# Patient Record
Sex: Female | Born: 1995 | Race: Black or African American | Hispanic: No | Marital: Single | Smoking: Current some day smoker
Health system: Southern US, Community
[De-identification: ages and names within clinical notes are randomized; demographics above are authoritative.]

## PROBLEM LIST (undated history)

## (undated) ENCOUNTER — Inpatient Hospital Stay (HOSPITAL_COMMUNITY): Payer: Self-pay

## (undated) DIAGNOSIS — D649 Anemia, unspecified: Secondary | ICD-10-CM

## (undated) DIAGNOSIS — R011 Cardiac murmur, unspecified: Secondary | ICD-10-CM

## (undated) HISTORY — PX: NO PAST SURGERIES: SHX2092

---
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
## 2014-02-22 ENCOUNTER — Ambulatory Visit: Payer: Medicaid Other | Admitting: Family Medicine

## 2014-04-20 ENCOUNTER — Ambulatory Visit: Payer: Medicaid Other | Admitting: Family Medicine

## 2014-06-04 ENCOUNTER — Emergency Department (HOSPITAL_COMMUNITY)
Admission: EM | Admit: 2014-06-04 | Discharge: 2014-06-04 | Disposition: A | Discharge status: Home / Self Care | Payer: Medicaid Other | Attending: Emergency Medicine | Admitting: Emergency Medicine

## 2014-06-04 ENCOUNTER — Encounter (HOSPITAL_COMMUNITY): Payer: Self-pay

## 2014-06-04 DIAGNOSIS — Z792 Long term (current) use of antibiotics: Secondary | ICD-10-CM | POA: Diagnosis not present

## 2014-06-04 DIAGNOSIS — Z791 Long term (current) use of non-steroidal anti-inflammatories (NSAID): Secondary | ICD-10-CM | POA: Insufficient documentation

## 2014-06-04 DIAGNOSIS — Z862 Personal history of diseases of the blood and blood-forming organs and certain disorders involving the immune mechanism: Secondary | ICD-10-CM | POA: Insufficient documentation

## 2014-06-04 DIAGNOSIS — Z3202 Encounter for pregnancy test, result negative: Secondary | ICD-10-CM | POA: Insufficient documentation

## 2014-06-04 DIAGNOSIS — Z880 Allergy status to penicillin: Secondary | ICD-10-CM | POA: Diagnosis not present

## 2014-06-04 DIAGNOSIS — N898 Other specified noninflammatory disorders of vagina: Secondary | ICD-10-CM | POA: Diagnosis not present

## 2014-06-04 LAB — URINE MICROSCOPIC-ADD ON

## 2014-06-04 LAB — POC URINE PREG, ED: Preg Test, Ur: NEGATIVE

## 2014-06-04 LAB — URINALYSIS, ROUTINE W REFLEX MICROSCOPIC
Bilirubin Urine: NEGATIVE
Glucose, UA: NEGATIVE mg/dL
KETONES UR: NEGATIVE mg/dL
Nitrite: NEGATIVE
PH: 6.5 (ref 5.0–8.0)
Protein, ur: NEGATIVE mg/dL
Specific Gravity, Urine: 1.024 (ref 1.005–1.030)
UROBILINOGEN UA: 1 mg/dL (ref 0.0–1.0)

## 2014-06-04 LAB — WET PREP, GENITAL
Clue Cells Wet Prep HPF POC: NONE SEEN
TRICH WET PREP: NONE SEEN
Yeast Wet Prep HPF POC: NONE SEEN

## 2014-06-04 NOTE — ED Notes (Signed)
MD at the bedside  

## 2014-06-04 NOTE — ED Notes (Signed)
Per Patient, Patient has started using scented body wash, and she "thinks it may have messed up my pH." Patient reports scant amount of vaginal discharge and some foul odor. Denies pain, discomfort, burning or itching. Patient is alert and oriented x4. Patient denies any other symptoms.

## 2014-06-04 NOTE — ED Notes (Signed)
MD Ward at the bedside. Pelvis exam completed with assistance.

## 2014-06-04 NOTE — ED Notes (Signed)
Patient given drink Ale

## 2014-06-04 NOTE — ED Provider Notes (Signed)
TIME SEEN: 11:20 AM  CHIEF COMPLAINT: Vaginal discharge  HPI: Patient is a 19 year old G1 P1 who presents to the emergency department 3-4 days of vaginal discharge. Describes it as looking like "mucus" and having a foul odor. Last menstrual period was April 13. She has not been sexually active since 2015. No history of STDs. Denies fevers, chills, nausea, vomiting or diarrhea. No dysuria or hematuria. No abnormal vaginal bleeding. Denies abdominal pain. No history of abdominal surgery.  ROS: See HPI Constitutional: no fever  Eyes: no drainage  ENT: no runny nose   Cardiovascular:  no chest pain  Resp: no SOB  GI: no vomiting GU: no dysuria Integumentary: no rash  Allergy: no hives  Musculoskeletal: no leg swelling  Neurological: no slurred speech ROS otherwise negative  PAST MEDICAL HISTORY/PAST SURGICAL HISTORY:  Past Medical History  Diagnosis Date  . Anemia 2013    MEDICATIONS:  Prior to Admission medications   Medication Sig Start Date End Date Taking? Authorizing Provider  erythromycin ophthalmic ointment Place 1 application into the right eye 4 (four) times daily. One-half inch (1.25 cm) four times daily for  7 days. No contacts x10 days. 06/28/13   Shelva MajesticStephen O Hunter, MD  ibuprofen (ADVIL,MOTRIN) 800 MG tablet Take 1 tablet (800 mg total) by mouth 3 (three) times daily. Take with food 05/25/13   Mellody DrownLauren Parker, PA-C    ALLERGIES:  Allergies  Allergen Reactions  . Penicillins Rash  . Sulfa Antibiotics Rash  . Sulfonamide Derivatives Rash    SOCIAL HISTORY:  History  Substance Use Topics  . Smoking status: Never Smoker   . Smokeless tobacco: Not on file  . Alcohol Use: No     Comment: 1x month    FAMILY HISTORY: Family History  Problem Relation Age of Onset  . Hypertension Mother   . Diabetes Maternal Aunt   . Hypertension Maternal Aunt   . Thyroid disease Maternal Aunt   . Hypertension Maternal Uncle   . Diabetes Maternal Grandmother   . Diabetes Maternal  Grandfather     EXAM: BP 110/65 mmHg  Pulse 92  Temp(Src) 98.5 F (36.9 C) (Oral)  Resp 16  Ht 5\' 5"  (1.651 m)  Wt 163 lb (73.936 kg)  BMI 27.12 kg/m2  SpO2 97%  LMP 05/14/2014 CONSTITUTIONAL: Alert and oriented and responds appropriately to questions. Well-appearing; well-nourished, nontoxic, pleasant, smiling HEAD: Normocephalic EYES: Conjunctivae clear, PERRL ENT: normal nose; no rhinorrhea; moist mucous membranes; pharynx without lesions noted NECK: Supple, no meningismus, no LAD  CARD: RRR; S1 and S2 appreciated; no murmurs, no clicks, no rubs, no gallops RESP: Normal chest excursion without splinting or tachypnea; breath sounds clear and equal bilaterally; no wheezes, no rhonchi, no rales,  ABD/GI: Normal bowel sounds; non-distended; soft, non-tender, no rebound, no guarding GU:  Normal external genitalia, patient has a moderate amount of thin white vaginal discharge on exam with a slightly friable cervix that has a small amount of bleeding after being swabbed but no adnexal toes or fullness, no cervical motion tenderness, no significant vaginal bleeding BACK:  The back appears normal and is non-tender to palpation, there is no CVA tenderness EXT: Normal ROM in all joints; non-tender to palpation; no edema; normal capillary refill; no cyanosis    SKIN: Normal color for age and race; warm NEURO: Moves all extremities equally PSYCH: The patient's mood and manner are appropriate. Grooming and personal hygiene are appropriate.  MEDICAL DECISION MAKING: Patient here with vaginal discharge. Gonorrhea and Chlamydia cultures are  pending. She declines HIV and syphilis testing today. Wet prep shows many wbc's but no East, clue cells or trichomonas. Pregnancy test is negative. Urine does show moderate hemoglobin, moderate leukocytes, few bacteria but also many squamous cells. May be dirty catch. She has known urinary symptoms at this time I do not feel she needs to be started on antibiotics.  We'll get outpatient OB/GYN follow-up information. Discussed return precautions. She verbalized understanding and is comfortable with plan.      Layla Maw Selvin Yun, DO 06/04/14 1307

## 2014-06-04 NOTE — Discharge Instructions (Signed)
You were seen in the emergency department for vaginal discharge. Your pelvic swabs today showed no yeast, bacterial vaginosis or trichomonas. Chlamydia and gonorrhea cultures are pending. If you are positive for these you  will be contacted and started on antibiotics. Your urine showed possible signs of infection may give in your not having symptoms of UTI I do not think you need to be started on antibiotics at this time. If you begin having pain when you urinate, blood in your urine, feeling you are urinating more frequently or have increased urgency to use the restroom, please follow-up with your primary care provider.     [email or web address] [address] [place], [place] [phone]    [facility] [email or web address] [address] [place], [place] [phone](336) [phone]    [facility] [address] [place], [place] [phone]   [email or web address] [address] [place], [place] [phone]   [facility] [email or web address] [address] [place], [place] [phone]   [facility] [email or web address] [address] [place], [place] [phone]

## 2014-06-05 LAB — GC/CHLAMYDIA PROBE AMP (~~LOC~~) NOT AT ARMC
CHLAMYDIA, DNA PROBE: NEGATIVE
Neisseria Gonorrhea: NEGATIVE

## 2014-06-05 LAB — URINE CULTURE
COLONY COUNT: NO GROWTH
Culture: NO GROWTH

## 2014-06-06 ENCOUNTER — Encounter (HOSPITAL_COMMUNITY): Payer: Self-pay | Admitting: Emergency Medicine

## 2014-06-06 ENCOUNTER — Emergency Department (HOSPITAL_COMMUNITY): Payer: Medicaid Other

## 2014-06-06 ENCOUNTER — Emergency Department (HOSPITAL_COMMUNITY)
Admission: EM | Admit: 2014-06-06 | Discharge: 2014-06-07 | Disposition: A | Discharge status: Home / Self Care | Payer: Medicaid Other | Attending: Emergency Medicine | Admitting: Emergency Medicine

## 2014-06-06 DIAGNOSIS — Z862 Personal history of diseases of the blood and blood-forming organs and certain disorders involving the immune mechanism: Secondary | ICD-10-CM | POA: Diagnosis not present

## 2014-06-06 DIAGNOSIS — Z720 Tobacco use: Secondary | ICD-10-CM | POA: Diagnosis not present

## 2014-06-06 DIAGNOSIS — R079 Chest pain, unspecified: Secondary | ICD-10-CM | POA: Diagnosis not present

## 2014-06-06 DIAGNOSIS — R52 Pain, unspecified: Secondary | ICD-10-CM | POA: Diagnosis present

## 2014-06-06 DIAGNOSIS — R011 Cardiac murmur, unspecified: Secondary | ICD-10-CM | POA: Diagnosis not present

## 2014-06-06 DIAGNOSIS — Z880 Allergy status to penicillin: Secondary | ICD-10-CM | POA: Diagnosis not present

## 2014-06-06 DIAGNOSIS — E876 Hypokalemia: Secondary | ICD-10-CM | POA: Insufficient documentation

## 2014-06-06 HISTORY — DX: Cardiac murmur, unspecified: R01.1

## 2014-06-06 LAB — CBC WITH DIFFERENTIAL/PLATELET
Basophils Absolute: 0 10*3/uL (ref 0.0–0.1)
Basophils Relative: 0 % (ref 0–1)
Eosinophils Absolute: 0.1 10*3/uL (ref 0.0–0.7)
Eosinophils Relative: 2 % (ref 0–5)
HEMATOCRIT: 35 % — AB (ref 36.0–46.0)
Hemoglobin: 11 g/dL — ABNORMAL LOW (ref 12.0–15.0)
Lymphocytes Relative: 38 % (ref 12–46)
Lymphs Abs: 1.4 10*3/uL (ref 0.7–4.0)
MCH: 28.7 pg (ref 26.0–34.0)
MCHC: 31.4 g/dL (ref 30.0–36.0)
MCV: 91.4 fL (ref 78.0–100.0)
Monocytes Absolute: 0.2 10*3/uL (ref 0.1–1.0)
Monocytes Relative: 6 % (ref 3–12)
NEUTROS PCT: 54 % (ref 43–77)
Neutro Abs: 2 10*3/uL (ref 1.7–7.7)
Platelets: 315 10*3/uL (ref 150–400)
RBC: 3.83 MIL/uL — AB (ref 3.87–5.11)
RDW: 13.9 % (ref 11.5–15.5)
WBC: 3.7 10*3/uL — AB (ref 4.0–10.5)

## 2014-06-06 LAB — I-STAT CHEM 8, ED
BUN: 9 mg/dL (ref 6–23)
Calcium, Ion: 1.23 mmol/L (ref 1.12–1.23)
Chloride: 106 mmol/L (ref 96–112)
Creatinine, Ser: 0.7 mg/dL (ref 0.50–1.10)
Glucose, Bld: 133 mg/dL — ABNORMAL HIGH (ref 70–99)
HCT: 35 % — ABNORMAL LOW (ref 36.0–46.0)
Hemoglobin: 11.9 g/dL — ABNORMAL LOW (ref 12.0–15.0)
POTASSIUM: 3.2 mmol/L — AB (ref 3.5–5.1)
SODIUM: 142 mmol/L (ref 135–145)
TCO2: 19 mmol/L (ref 0–100)

## 2014-06-06 NOTE — ED Notes (Signed)
Pt states her body aches, has general weakness, and chest hurts  Pt denies any cold symptoms, N/V/D  Sxs started 2 days ago

## 2014-06-06 NOTE — ED Provider Notes (Signed)
CSN: [id number]     Arrival date & time 06/06/14  2147 History  This chart was scribed for non-physician practitioner, Earley Favor, NP, working with Mancel Bale, MD, by Modena Jansky, ED Scribe. This patient was seen in room WTR7/WTR7 and the patient's care was started at 10:13 PM.    Chief Complaint  Patient presents with  . Generalized Body Aches   The history is provided by the patient. No language interpreter was used.   HPI Comments: Jill Dennis is a 19 y.o. female who presents to the Emergency Department complaining of intermittent moderate chest pain that started 2 days ago. She reports that she has been having chest pain present only with deep breaths the past 2 days ago. She states that she has also been having generalized myalgias and weakness. She reports taking ibuprofen without any relief. She denies any sick contacts or hx of asthma or allerigies. She also denies any cough, rhinorrhea, nausea, vomiting, diarrhea, fever, rash, sore throat, ear pain, decreased appetite, or urinary symptoms.  She does have a history of anemia  Past Medical History  Diagnosis Date  . Anemia 2013  . Heart murmur    Past Surgical History  Procedure Laterality Date  . No past surgeries     Family History  Problem Relation Age of Onset  . Hypertension Mother   . Diabetes Maternal Aunt   . Hypertension Maternal Aunt   . Thyroid disease Maternal Aunt   . Hypertension Maternal Uncle   . Diabetes Maternal Grandmother   . Diabetes Maternal Grandfather    History  Substance Use Topics  . Smoking status: Current Every Day Smoker    Types: Cigarettes  . Smokeless tobacco: Not on file  . Alcohol Use: Yes     Comment: social   OB History    Gravida Para Term Preterm AB TAB SAB Ectopic Multiple Living   0 0 0 0 0 0 1     Review of Systems  Constitutional: Negative for fever and appetite change.  HENT: Negative for ear pain, rhinorrhea and sore throat.   Respiratory: Negative for  cough.   Cardiovascular: Positive for chest pain.  Gastrointestinal: Negative for nausea, vomiting and diarrhea.  Genitourinary: Negative for dysuria.  Musculoskeletal: Positive for myalgias (generalized).  Skin: Negative for rash.  Neurological: Positive for weakness (generalized).  All other systems reviewed and are negative.   Allergies  Penicillins; Sulfa antibiotics; and Sulfonamide derivatives  Home Medications   Prior to Admission medications   Medication Sig Start Date End Date Taking? Authorizing Provider  erythromycin ophthalmic ointment Place 1 application into the right eye 4 (four) times daily. One-half inch (1.25 cm) four times daily for  7 days. No contacts x10 days. Patient not taking: Reported on 06/04/2014 06/28/13   Shelva Majestic, MD  ibuprofen (ADVIL,MOTRIN) 800 MG tablet Take 1 tablet (800 mg total) by mouth 3 (three) times daily. Take with food Patient not taking: Reported on 06/04/2014 05/25/13   Mellody Drown, PA-C   BP 124/94 mmHg  Pulse 80  Temp(Src) 99 F (37.2 C) (Oral)  Resp 18  SpO2 100%  LMP 05/24/2014 (Approximate) Physical Exam  Constitutional: She is oriented to person, place, and time. She appears well-developed and well-nourished.  Eyes: Pupils are equal, round, and reactive to light.  Neck: Normal range of motion.  Cardiovascular: Normal rate.   Pulmonary/Chest: She exhibits tenderness.  Abdominal: Soft. Bowel sounds are normal.  Musculoskeletal: Normal range of motion.  Neurological: She is alert and oriented to person, place, and time.  Skin: Skin is warm.  Nursing note and vitals reviewed.   ED Course  Procedures (including critical care time) DIAGNOSTIC STUDIES: Oxygen Saturation is 100% on RA, normal by my interpretation.    COORDINATION OF CARE: 10:17 PM- Pt advised of plan for treatment which includes radiology and pt agrees.  Labs Review Labs Reviewed  CBC WITH DIFFERENTIAL/PLATELET - Abnormal; Notable for the following:     WBC 3.7 (*)    RBC 3.83 (*)    Hemoglobin 11.0 (*)    HCT 35.0 (*)    All other components within normal limits  I-STAT CHEM 8, ED - Abnormal; Notable for the following:    Potassium 3.2 (*)    Glucose, Bld 133 (*)    Hemoglobin 11.9 (*)    HCT 35.0 (*)    All other components within normal limits    Imaging Review Dg Chest 2 View  06/06/2014   CLINICAL DATA:  19 year old female with chest pain and shortness of breath  EXAM: CHEST  2 VIEW  COMPARISON:  Prior chest x-ray 02/26/2001  FINDINGS: The lungs are clear and negative for focal airspace consolidation, pulmonary edema or suspicious pulmonary nodule. No pleural effusion or pneumothorax. Cardiac and mediastinal contours are within normal limits. No acute fracture or lytic or blastic osseous lesions. The visualized upper abdominal bowel gas pattern is unremarkable.  IMPRESSION: Normal chest x-ray.   Electronically Signed   By: Malachy MoanHeath  McCullough M.D.   On: 06/06/2014 23:09     EKG Interpretation None     She was found to have a slightly lower potassium than normal.  She's been given supplements for the next 10 days as well as dietary recommendations.  This is most likely her feeling of weakness and fatigue MDM   Final diagnoses:  Pain  Hypokalemia         Earley FavorGail Zennie Ayars, NP 06/07/14 0009  Mancel BaleElliott Wentz, MD 06/08/14 1416

## 2014-06-07 MED ORDER — POTASSIUM CHLORIDE CRYS ER 10 MEQ PO TBCR: 10.0000 meq | EXTENDED_RELEASE_TABLET | Freq: Every day | ORAL | Status: DC

## 2014-06-07 MED ORDER — POTASSIUM CHLORIDE CRYS ER 20 MEQ PO TBCR
20.0000 meq | EXTENDED_RELEASE_TABLET | Freq: Once | ORAL | Status: AC
  Administered 2014-06-07: 20 meq via ORAL
  Filled 2014-06-07: qty 1

## 2014-06-07 NOTE — Discharge Instructions (Signed)
Chest x-ray is normal, but your blood work shows that you are low potassium been given a supplement and some dietary recommendations

## 2014-08-10 ENCOUNTER — Ambulatory Visit (INDEPENDENT_AMBULATORY_CARE_PROVIDER_SITE_OTHER): Payer: Medicaid Other | Admitting: Family Medicine

## 2014-08-10 ENCOUNTER — Encounter: Payer: Self-pay | Admitting: Family Medicine

## 2014-08-10 VITALS — BP 112/60 | HR 71 | Temp 98.7°F | Ht 65.0 in | Wt 155.1 lb

## 2014-08-10 DIAGNOSIS — M25511 Pain in right shoulder: Secondary | ICD-10-CM

## 2014-08-10 NOTE — Patient Instructions (Signed)
This is most likely a muscle spasm. Use heat therapy to this area. Also try ibuprofen 600mg  3-4 times daily with food and plenty of water. Do exercises 1-2 times EVERY day to stretch neck muscles. Come back in 2 weeks if no better or immediately if you have new symptoms, numbness/tingling in your arms, weakness, or other concerns.  Best,  Leona SingletonMaria T Viliami Bracco, MD

## 2014-08-10 NOTE — Progress Notes (Signed)
Patient ID: Jill BellowCiera M Patnode, female   DOB: 1995/06/20, 19 y.o.   MRN: [id number] Subjective:   CC: Right arm pain  HPI:   Patient presents to [facility] with 2 days of right arm pain with no obvious inciting factor. Pain is worse with heavy lifting especially if 90 degrees or more. Pain is entire top shoulder, unable to further specify. Denies neck pain, numbness, or tingling. Unable to further characterize pain. Radiates down arm slightly but not to elbow or beyond. Denies fevers, chills, swelling, redness, or history of problem with this area. Has tried ibuprofen 800mg  x 2 which did not help. ROM is limited by pain. Feels a "clicking" in the entire top shoulder. No skin changes or issues with neck ROM.  Review of Systems - Per HPI.   PMH - acne, anemia    Objective:  Physical Exam BP 112/60 mmHg  Pulse 71  Temp(Src) 98.7 F (37.1 C) (Oral)  Ht 5\' 5"  (1.651 m)  Wt 155 lb 1.6 oz (70.353 kg)  BMI 25.81 kg/m2 GEN: NAD EXT: No erythema or deformity of shoudler/neck Right shoulder ROM limited by pain (active flexion and abduction right shoulder to 160 deg, full passive ROM) Tenderness generalized to anterior shoulder and over acromion Mild bicipital groove tenderness along with trapezius tenderness No rash Positive speeds, empty can, and hawkins testing     Assessment:     Jill BellowCiera M Strother is a 19 y.o. female here for shoulder pain.    Plan:     # See problem list and after visit summary for problem-specific plans.   Follow-up: Follow up in 2 weeks to re-evaluate pain.    Leona SingletonMaria T Orlanda Lemmerman, MD [facility]

## 2014-08-17 ENCOUNTER — Encounter (HOSPITAL_COMMUNITY): Payer: Self-pay | Admitting: Emergency Medicine

## 2014-08-17 ENCOUNTER — Emergency Department (HOSPITAL_COMMUNITY)
Admission: EM | Admit: 2014-08-17 | Discharge: 2014-08-17 | Disposition: A | Discharge status: Home / Self Care | Payer: Medicaid Other | Attending: Emergency Medicine | Admitting: Emergency Medicine

## 2014-08-17 ENCOUNTER — Emergency Department (HOSPITAL_COMMUNITY): Payer: Medicaid Other

## 2014-08-17 DIAGNOSIS — Z720 Tobacco use: Secondary | ICD-10-CM | POA: Diagnosis not present

## 2014-08-17 DIAGNOSIS — R011 Cardiac murmur, unspecified: Secondary | ICD-10-CM | POA: Diagnosis not present

## 2014-08-17 DIAGNOSIS — M25511 Pain in right shoulder: Secondary | ICD-10-CM | POA: Insufficient documentation

## 2014-08-17 DIAGNOSIS — M79601 Pain in right arm: Secondary | ICD-10-CM | POA: Diagnosis present

## 2014-08-17 DIAGNOSIS — Z862 Personal history of diseases of the blood and blood-forming organs and certain disorders involving the immune mechanism: Secondary | ICD-10-CM | POA: Insufficient documentation

## 2014-08-17 DIAGNOSIS — Z880 Allergy status to penicillin: Secondary | ICD-10-CM | POA: Insufficient documentation

## 2014-08-17 DIAGNOSIS — Z79899 Other long term (current) drug therapy: Secondary | ICD-10-CM | POA: Diagnosis not present

## 2014-08-17 MED ORDER — OXYCODONE-ACETAMINOPHEN 5-325 MG PO TABS
ORAL_TABLET | ORAL | Status: AC
  Filled 2014-08-17: qty 1

## 2014-08-17 MED ORDER — NAPROXEN 500 MG PO TABS: 500.0000 mg | ORAL_TABLET | Freq: Two times a day (BID) | ORAL | Status: DC

## 2014-08-17 MED ORDER — OXYCODONE-ACETAMINOPHEN 5-325 MG PO TABS: 1.0000 | ORAL_TABLET | Freq: Once | ORAL | Status: DC

## 2014-08-17 NOTE — ED Notes (Signed)
Patient transported to X-ray 

## 2014-08-17 NOTE — ED Notes (Signed)
Pt states- right shoulder and arm pain starting two weeks ago. Saw an MD last week for same. No prescriptions given but given stretches, reports no relief with stretches. Pt unable to lift her right arm up and has right shoulder and arm pain at rest. Pt took Ibuprofen with no relief.

## 2014-08-17 NOTE — ED Notes (Signed)
Pt refused a percocet

## 2014-08-17 NOTE — ED Provider Notes (Signed)
CSN: [id number]     Arrival date & time 08/17/14  1905 History  This chart was scribed for non-physician provider Jaynie Crumble, PA-C, working with Toy Cookey, MD by Phillis Haggis, ED Scribe. This patient was seen in room [facility] and patient care was started at 8:06 PM.    Chief Complaint  Patient presents with  . Arm Pain   The history is provided by the patient. No language interpreter was used.  HPI Comments: Jill Dennis is a 19 y.o. female who presents to the Emergency Department complaining ofright shoulder and arm pain onset two weeks ago. She states that she cannot lift her arm; was seen by her PCP who told her it was a muscular strain, no x-rays were performed. She states that she does not know if any injury could have caused the pain but states she does some heavy lifting at work. She reports some numbness to her hands; has been stretching the arm to no relief and states pain worsens at night. She denies fever, chills, neck pain, or weakness. Denies history of similar symptoms.   Past Medical History  Diagnosis Date  . Anemia 2013  . Heart murmur    Past Surgical History  Procedure Laterality Date  . No past surgeries     Family History  Problem Relation Age of Onset  . Hypertension Mother   . Diabetes Maternal Aunt   . Hypertension Maternal Aunt   . Thyroid disease Maternal Aunt   . Hypertension Maternal Uncle   . Diabetes Maternal Grandmother   . Diabetes Maternal Grandfather    History  Substance Use Topics  . Smoking status: Current Every Day Smoker    Types: Cigarettes  . Smokeless tobacco: Not on file  . Alcohol Use: Yes     Comment: social   OB History    Gravida Para Term Preterm AB TAB SAB Ectopic Multiple Living   0 0 0 0 0 0 1     Review of Systems  Constitutional: Negative for fever and chills.  Musculoskeletal: Positive for arthralgias. Negative for neck pain.  Neurological: Positive for numbness. Negative for weakness.    Allergies  Penicillins; Sulfa antibiotics; and Sulfonamide derivatives  Home Medications   Prior to Admission medications   Medication Sig Start Date End Date Taking? Authorizing Provider  erythromycin ophthalmic ointment Place 1 application into the right eye 4 (four) times daily. One-half inch (1.25 cm) four times daily for  7 days. No contacts x10 days. Patient not taking: Reported on 06/04/2014 06/28/13   Shelva Majestic, MD  ibuprofen (ADVIL,MOTRIN) 800 MG tablet Take 1 tablet (800 mg total) by mouth 3 (three) times daily. Take with food Patient not taking: Reported on 06/04/2014 05/25/13   Mellody Drown, PA-C  potassium chloride SA (K-DUR,KLOR-CON) 10 MEQ tablet Take 1 tablet (10 mEq total) by mouth daily. 06/07/14   Earley Favor, NP   BP 115/66 mmHg  Pulse 76  Temp(Src) 98.6 F (37 C) (Oral)  Resp 18  Ht  (1.651 m)  Wt 156 lb (70.761 kg)  BMI 25.96 kg/m2  SpO2 99%  LMP 08/17/2014  Physical Exam  Constitutional: She is oriented to person, place, and time. She appears well-developed and well-nourished.  HENT:  Head: Normocephalic and atraumatic.  Eyes: EOM are normal.  Neck: Normal range of motion. Neck supple.  Cardiovascular: Normal rate, regular rhythm and normal heart sounds.   Pulmonary/Chest: Effort normal and breath sounds normal. No respiratory distress.  She has no wheezes. She has no rales.  Musculoskeletal: Normal range of motion.  ttp over right shoulder diffusely. Pain with any rom. Actively unable to raise arm more than 40 degrees. Passively full rom just with pain. Distal radial pulses intact. Normal hand and elbow. Grip strength 5/5.     Neurological: She is alert and oriented to person, place, and time.  Skin: Skin is warm and dry.  Psychiatric: She has a normal mood and affect. Her behavior is normal.  Nursing note and vitals reviewed.   ED Course  Procedures (including critical care time) DIAGNOSTIC STUDIES: Oxygen Saturation is 99% on RA,  normal by my interpretation.    COORDINATION OF CARE: 8:08 PM-Discussed treatment plan which includes stretches and x-ray with pt at bedside and pt agreed to plan.   Labs Review Labs Reviewed - No data to display  Imaging Review Dg Shoulder Right  08/17/2014   CLINICAL DATA:  Sharp right shoulder pain for 2 weeks. No known injury.  EXAM: RIGHT SHOULDER - 2+ VIEW  COMPARISON:  None.  FINDINGS: There is no evidence of fracture or dislocation. There is no evidence of arthropathy or other focal bone abnormality. Soft tissues are unremarkable.  IMPRESSION: Normal exam.   Electronically Signed   By: Francene BoyersJames  Maxwell M.D.   On: 08/17/2014 20:52     EKG Interpretation None      MDM   Final diagnoses:  Right shoulder pain    Patient with right shoulder pain. She does not remember an injury. 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 Pain for 2 weeks, worsened since yesterday. She was seen by her doctor a week ago and was started on NSAIDs. She stated that "they did not do nothing for me." I tried to explain to her that we try to treat most injuries conservatively initially, however if her pain does not improve she does need to follow-up for further treatment. At this time she does have pain with active range of motion of the shoulder and limited range of motion. I did get an x-ray, which was negative. I explained to her she needs to start doing some stretching exercises and physical therapy at home and I have provided her with a list of exercises to start doing daily. She is neurovascularly intact. I do not think she needs any further imaging or tests on emergent basis. Is no evidence of infection. She is afebrile with normal vital signs. She'll be discharged home with NSAIDs and follow-up with primary care doctor.   When I was discussing patient's results and treatment plan, patient kept rolling her eyes and would not answer me when I asked her questions. Patient finally  said "I hear you" while taxing on her phone without making eye contact.   Filed Vitals:   08/17/14 1939 08/17/14 1952  BP: 115/66   Pulse: 76   Temp: 98.6 F (37 C)   TempSrc: Oral   Resp: 18   Height:  5\' 5"  (1.651 m)  Weight:  156 lb (70.761 kg)  SpO2: 99%    I personally performed the services described in this documentation, which was scribed in my presence. The recorded information has been reviewed and is accurate.   Jaynie Crumbleatyana Carolan Avedisian, PA-C 08/17/14 2125  Toy CookeyMegan Docherty, MD 08/19/14 [phone]

## 2014-08-17 NOTE — Discharge Instructions (Signed)
Please follow up with your doctor for further treatment. Start at home shoulder exercises  Shoulder Pain The shoulder is the joint that connects your arms to your body. The bones that form the shoulder joint include the upper arm bone (humerus), the shoulder blade (scapula), and the collarbone (clavicle). The top of the humerus is shaped like a ball and fits into a rather flat socket on the scapula (glenoid cavity). A combination of muscles and strong, fibrous tissues that connect muscles to bones (tendons) support your shoulder joint and hold the ball in the socket. Small, fluid-filled sacs (bursae) are located in different areas of the joint. They act as cushions between the bones and the overlying soft tissues and help reduce friction between the gliding tendons and the bone as you move your arm. Your shoulder joint allows a wide range of motion in your arm. This range of motion allows you to do things like scratch your back or throw a ball. However, this range of motion also makes your shoulder more prone to pain from overuse and injury. Causes of shoulder pain can originate from both injury and overuse and usually can be grouped in the following four categories:  Redness, swelling, and pain (inflammation) of the tendon (tendinitis) or the bursae (bursitis).  Instability, such as a dislocation of the joint.  Inflammation of the joint (arthritis).  Broken bone (fracture). HOME CARE INSTRUCTIONS   Apply ice to the sore area.  Put ice in a plastic bag.  Place a towel between your skin and the bag.  Leave the ice on for 15-20 minutes, 3-4 times per day for the first 2 days, or as directed by your health care provider.  Stop using cold packs if they do not help with the pain.  If you have a shoulder sling or immobilizer, wear it as long as your caregiver instructs. Only remove it to shower or bathe. Move your arm as little as possible, but keep your hand moving to prevent swelling.  Squeeze  a soft ball or foam pad as much as possible to help prevent swelling.  Only take over-the-counter or prescription medicines for pain, discomfort, or fever as directed by your caregiver. SEEK MEDICAL CARE IF:   Your shoulder pain increases, or new pain develops in your arm, hand, or fingers.  Your hand or fingers become cold and numb.  Your pain is not relieved with medicines. SEEK IMMEDIATE MEDICAL CARE IF:   Your arm, hand, or fingers are numb or tingling.  Your arm, hand, or fingers are significantly swollen or turn white or blue. MAKE SURE YOU:   Understand these instructions.  Will watch your condition.  Will get help right away if you are not doing well or get worse. Document Released: 11/06/2004 Document Revised: 06/13/2013 Document Reviewed: 01/11/2011 [facility] Patient Information 2015 [place], [place]. This information is not intended to replace advice given to you by your health care provider. Make sure you discuss any questions you have with your health care provider. .   Shoulder Exercises EXERCISES  RANGE OF MOTION (ROM) AND STRETCHING EXERCISES These exercises may help you when beginning to rehabilitate your injury. Your symptoms may resolve with or without further involvement from your physician, physical therapist or athletic trainer. While completing these exercises, remember:   Restoring tissue flexibility helps normal motion to return to the joints. This allows healthier, less painful movement and activity.  An effective stretch should be held for at least 30 seconds.  A stretch should never  be painful. You should only feel a gentle lengthening or release in the stretched tissue. ROM - Pendulum  Bend at the waist so that your right / left arm falls away from your body. Support yourself with your opposite hand on a solid surface, such as a table or a countertop.  Your right / left arm should be perpendicular to the ground. If it is not perpendicular, you need  to lean over farther. Relax the muscles in your right / left arm and shoulder as much as possible.  Gently sway your hips and trunk so they move your right / left arm without any use of your right / left shoulder muscles.  Progress your movements so that your right / left arm moves side to side, then forward and backward, and finally, both clockwise and counterclockwise.  Complete __________ repetitions in each direction. Many people use this exercise to relieve discomfort in their shoulder as well as to gain range of motion. Repeat __________ times. Complete this exercise __________ times per day. STRETCH - Flexion, Standing  Stand with good posture. With an underhand grip on your right / left hand and an overhand grip on the opposite hand, grasp a broomstick or cane so that your hands are a little more than shoulder-width apart.  Keeping your right / left elbow straight and shoulder muscles relaxed, push the stick with your opposite hand to raise your right / left arm in front of your body and then overhead. Raise your arm until you feel a stretch in your right / left shoulder, but before you have increased shoulder pain.  Try to avoid shrugging your right / left shoulder as your arm rises by keeping your shoulder blade tucked down and toward your mid-back spine. Hold __________ seconds.  Slowly return to the starting position. Repeat __________ times. Complete this exercise __________ times per day. STRETCH - Internal Rotation  Place your right / left hand behind your back, palm-up.  Throw a towel or belt over your opposite shoulder. Grasp the towel/belt with your right / left hand.  While keeping an upright posture, gently pull up on the towel/belt until you feel a stretch in the front of your right / left shoulder.  Avoid shrugging your right / left shoulder as your arm rises by keeping your shoulder blade tucked down and toward your mid-back spine.  Hold __________. Release the  stretch by lowering your opposite hand. Repeat __________ times. Complete this exercise __________ times per day. STRETCH - External Rotation and Abduction  Stagger your stance through a doorframe. It does not matter which foot is forward.  As instructed by your physician, physical therapist or athletic trainer, place your hands:  And forearms above your head and on the door frame.  And forearms at head-height and on the door frame.  At elbow-height and on the door frame.  Keeping your head and chest upright and your stomach muscles tight to prevent over-extending your low-back, slowly shift your weight onto your front foot until you feel a stretch across your chest and/or in the front of your shoulders.  Hold __________ seconds. Shift your weight to your back foot to release the stretch. Repeat __________ times. Complete this stretch __________ times per day.  STRENGTHENING EXERCISES  These exercises may help you when beginning to rehabilitate your injury. They may resolve your symptoms with or without further involvement from your physician, physical therapist or athletic trainer. While completing these exercises, remember:   Muscles can gain both  the endurance and the strength needed for everyday activities through controlled exercises.  Complete these exercises as instructed by your physician, physical therapist or athletic trainer. Progress the resistance and repetitions only as guided.  You may experience muscle soreness or fatigue, but the pain or discomfort you are trying to eliminate should never worsen during these exercises. If this pain does worsen, stop and make certain you are following the directions exactly. If the pain is still present after adjustments, discontinue the exercise until you can discuss the trouble with your clinician.  If advised by your physician, during your recovery, avoid activity or exercises which involve actions that place your right / left hand or  elbow above your head or behind your back or head. These positions stress the tissues which are trying to heal. STRENGTH - Scapular Depression and Adduction  With good posture, sit on a firm chair. Supported your arms in front of you with pillows, arm rests or a table top. Have your elbows in line with the sides of your body.  Gently draw your shoulder blades down and toward your mid-back spine. Gradually increase the tension without tensing the muscles along the top of your shoulders and the back of your neck.  Hold for __________ seconds. Slowly release the tension and relax your muscles completely before completing the next repetition.  After you have practiced this exercise, remove the arm support and complete it in standing as well as sitting. Repeat __________ times. Complete this exercise __________ times per day.  STRENGTH - External Rotators  Secure a rubber exercise band/tubing to a fixed object so that it is at the same height as your right / left elbow when you are standing or sitting on a firm surface.  Stand or sit so that the secured exercise band/tubing is at your side that is not injured.  Bend your elbow 90 degrees. Place a folded towel or small pillow under your right / left arm so that your elbow is a few inches away from your side.  Keeping the tension on the exercise band/tubing, pull it away from your body, as if pivoting on your elbow. Be sure to keep your body steady so that the movement is only coming from your shoulder rotating.  Hold __________ seconds. Release the tension in a controlled manner as you return to the starting position. Repeat __________ times. Complete this exercise __________ times per day.  STRENGTH - Supraspinatus  Stand or sit with good posture. Grasp a __________ weight or an exercise band/tubing so that your hand is "thumbs-up," like when you shake hands.  Slowly lift your right / left hand from your thigh into the air, traveling about 30  degrees from straight out at your side. Lift your hand to shoulder height or as far as you can without increasing any shoulder pain. Initially, many people do not lift their hands above shoulder height.  Avoid shrugging your right / left shoulder as your arm rises by keeping your shoulder blade tucked down and toward your mid-back spine.  Hold for __________ seconds. Control the descent of your hand as you slowly return to your starting position. Repeat __________ times. Complete this exercise __________ times per day.  STRENGTH - Shoulder Extensors  Secure a rubber exercise band/tubing so that it is at the height of your shoulders when you are either standing or sitting on a firm arm-less chair.  With a thumbs-up grip, grasp an end of the band/tubing in each hand. Straighten your elbows  and lift your hands straight in front of you at shoulder height. Step back away from the secured end of band/tubing until it becomes tense.  Squeezing your shoulder blades together, pull your hands down to the sides of your thighs. Do not allow your hands to go behind you.  Hold for __________ seconds. Slowly ease the tension on the band/tubing as you reverse the directions and return to the starting position. Repeat __________ times. Complete this exercise __________ times per day.  STRENGTH - Scapular Retractors  Secure a rubber exercise band/tubing so that it is at the height of your shoulders when you are either standing or sitting on a firm arm-less chair.  With a palm-down grip, grasp an end of the band/tubing in each hand. Straighten your elbows and lift your hands straight in front of you at shoulder height. Step back away from the secured end of band/tubing until it becomes tense.  Squeezing your shoulder blades together, draw your elbows back as you bend them. Keep your upper arm lifted away from your body throughout the exercise.  Hold __________ seconds. Slowly ease the tension on the band/tubing  as you reverse the directions and return to the starting position. Repeat __________ times. Complete this exercise __________ times per day. STRENGTH - Scapular Depressors  Find a sturdy chair without wheels, such as a from a dining room table.  Keeping your feet on the floor, lift your bottom from the seat and lock your elbows.  Keeping your elbows straight, allow gravity to pull your body weight down. Your shoulders will rise toward your ears.  Raise your body against gravity by drawing your shoulder blades down your back, shortening the distance between your shoulders and ears. Although your feet should always maintain contact with the floor, your feet should progressively support less body weight as you get stronger.  Hold __________ seconds. In a controlled and slow manner, lower your body weight to begin the next repetition. Repeat __________ times. Complete this exercise __________ times per day.  Document Released: 12/11/2004 Document Revised: 04/21/2011 Document Reviewed: 05/11/2008 [facility] Patient Information 2015 [place], [place]. This information is not intended to replace advice given to you by your health care provider. Make sure you discuss any questions you have with your health care provider.

## 2014-08-18 DIAGNOSIS — M25511 Pain in right shoulder: Secondary | ICD-10-CM | POA: Insufficient documentation

## 2014-08-18 NOTE — Assessment & Plan Note (Addendum)
Likely muscle spasming with exam with pain over trapezius, pain with active ROM in all directions but full passive ROM. Vague symptoms poorly differentiated by exam or hx. - Trigger point injection declined at this time. - Ibuprofen  TID-QID with food and water - Stretching exercises reviewed and recommended doing daily. - Heat therapy. - F/u 2 weeks PRN no improvement or immediately if numbness/weakness or new symptoms. Could do trigger point injection or consider imaging if persists.

## 2014-08-18 NOTE — Progress Notes (Signed)
Medical screening examination/treatment/procedure(s) were performed by a resident and as supervising physician I was immediately available for consultation/collaboration.  

## 2014-10-31 ENCOUNTER — Emergency Department (HOSPITAL_COMMUNITY): Payer: Medicaid Other

## 2014-10-31 ENCOUNTER — Encounter (HOSPITAL_COMMUNITY): Payer: Self-pay | Admitting: Emergency Medicine

## 2014-10-31 ENCOUNTER — Emergency Department (HOSPITAL_COMMUNITY)
Admission: EM | Admit: 2014-10-31 | Discharge: 2014-10-31 | Disposition: A | Discharge status: Home / Self Care | Payer: Medicaid Other | Attending: Emergency Medicine | Admitting: Emergency Medicine

## 2014-10-31 DIAGNOSIS — Z880 Allergy status to penicillin: Secondary | ICD-10-CM | POA: Insufficient documentation

## 2014-10-31 DIAGNOSIS — R509 Fever, unspecified: Secondary | ICD-10-CM | POA: Insufficient documentation

## 2014-10-31 DIAGNOSIS — R52 Pain, unspecified: Secondary | ICD-10-CM | POA: Insufficient documentation

## 2014-10-31 DIAGNOSIS — R011 Cardiac murmur, unspecified: Secondary | ICD-10-CM | POA: Diagnosis not present

## 2014-10-31 DIAGNOSIS — Z862 Personal history of diseases of the blood and blood-forming organs and certain disorders involving the immune mechanism: Secondary | ICD-10-CM | POA: Diagnosis not present

## 2014-10-31 DIAGNOSIS — Z791 Long term (current) use of non-steroidal anti-inflammatories (NSAID): Secondary | ICD-10-CM | POA: Insufficient documentation

## 2014-10-31 DIAGNOSIS — Z720 Tobacco use: Secondary | ICD-10-CM | POA: Diagnosis not present

## 2014-10-31 DIAGNOSIS — Z79899 Other long term (current) drug therapy: Secondary | ICD-10-CM | POA: Diagnosis not present

## 2014-10-31 DIAGNOSIS — R5381 Other malaise: Secondary | ICD-10-CM | POA: Diagnosis not present

## 2014-10-31 LAB — RAPID STREP SCREEN (MED CTR MEBANE ONLY): Streptococcus, Group A Screen (Direct): NEGATIVE

## 2014-10-31 MED ORDER — SODIUM CHLORIDE 0.9 % IV SOLN
1000.0000 mL | INTRAVENOUS | Status: DC
Start: 2014-10-31 — End: 2014-10-31

## 2014-10-31 MED ORDER — IBUPROFEN 800 MG PO TABS
800.0000 mg | ORAL_TABLET | Freq: Three times a day (TID) | ORAL | Status: DC
Start: 2014-10-31 — End: 2015-05-21

## 2014-10-31 MED ORDER — ACETAMINOPHEN 325 MG PO TABS
650.0000 mg | ORAL_TABLET | Freq: Once | ORAL | Status: AC | PRN
  Administered 2014-10-31: 650 mg via ORAL

## 2014-10-31 MED ORDER — ACETAMINOPHEN 325 MG PO TABS
ORAL_TABLET | ORAL | Status: AC
  Filled 2014-10-31: qty 2

## 2014-10-31 MED ORDER — ACETAMINOPHEN 500 MG PO TABS
500.0000 mg | ORAL_TABLET | Freq: Four times a day (QID) | ORAL | Status: DC | PRN
Start: 2014-10-31 — End: 2015-05-21

## 2014-10-31 MED ORDER — SODIUM CHLORIDE 0.9 % IV SOLN
1000.0000 mL | Freq: Once | INTRAVENOUS | Status: AC
  Administered 2014-10-31: 1000 mL via INTRAVENOUS

## 2014-10-31 NOTE — ED Notes (Signed)
Pt sts sore throat and body aches; pt noted to have fever; pt sts hx of hypokalemia and thinks could be same

## 2014-10-31 NOTE — Discharge Instructions (Signed)

## 2014-10-31 NOTE — ED Provider Notes (Signed)
CSN: [id number]     Arrival date & time 10/31/14  6962 History   First MD Initiated Contact with Patient 10/31/14 1126     Chief Complaint  Patient presents with  . Fever  . Generalized Body Aches     (Consider location/radiation/quality/duration/timing/severity/associated sxs/prior Treatment) HPI   Jill Dennis is a 19 y.o. female with no significant PMH who presents with 4 day history of gradually worsening generalized body aches and weakness with associated fever, chills, malaise, sore throat, shortness of breath, anorexia, neck pain, and headache. He has tried DayQuil, but found no relief of her symptoms. He makes it better. She endorses adequate by mouth intake. Denies cough, chest pain, abdominal pain, nausea, vomiting, diarrhea, or urinary symptoms. Last menstrual period was beginning of this month (approximately Sept 1).  She states she works in the [employer]. She also reports that her son was sick last week.    Past Medical History  Diagnosis Date  . Anemia 2013  . Heart murmur    Past Surgical History  Procedure Laterality Date  . No past surgeries     Family History  Problem Relation Age of Onset  . Hypertension Mother   . Diabetes Maternal Aunt   . Hypertension Maternal Aunt   . Thyroid disease Maternal Aunt   . Hypertension Maternal Uncle   . Diabetes Maternal Grandmother   . Diabetes Maternal Grandfather    Social History  Substance Use Topics  . Smoking status: Current Every Day Smoker    Types: Cigarettes  . Smokeless tobacco: None  . Alcohol Use: Yes     Comment: social   OB History    Gravida Para Term Preterm AB TAB SAB Ectopic Multiple Living   0 0 0 0 0 0 1     Review of Systems All other systems negative unless otherwise stated in HPI    Allergies  Penicillins; Sulfa antibiotics; and Sulfonamide derivatives  Home Medications   Prior to Admission medications   Medication Sig Start Date End Date Taking? Authorizing  Provider  erythromycin ophthalmic ointment Place 1 application into the right eye 4 (four) times daily. One-half inch (1.25 cm) four times daily for  7 days. No contacts x10 days. Patient not taking: Reported on 06/04/2014 06/28/13   Shelva Majestic, MD  ibuprofen (ADVIL,MOTRIN) 800 MG tablet Take 1 tablet (800 mg total) by mouth 3 (three) times daily. Take with food Patient not taking: Reported on 06/04/2014 05/25/13   Mellody Drown, PA-C  naproxen (NAPROSYN) 500 MG tablet Take 1 tablet (500 mg total) by mouth 2 (two) times daily. 08/17/14   Tatyana Kirichenko, PA-C  potassium chloride SA (K-DUR,KLOR-CON) 10 MEQ tablet Take 1 tablet (10 mEq total) by mouth daily. 06/07/14   Earley Favor, NP   BP 111/60 mmHg  Pulse 92  Temp(Src) 99.6 F (37.6 C) (Oral)  Resp 20  SpO2 97% Physical Exam  Constitutional: She is oriented to person, place, and time. She appears well-developed and well-nourished.  HENT:  Head: Normocephalic and atraumatic.  Mouth/Throat: Uvula is midline and oropharynx is clear and moist. No trismus in the jaw. No uvula swelling. No oropharyngeal exudate, posterior oropharyngeal edema, posterior oropharyngeal erythema or tonsillar abscesses.  Eyes: Pupils are equal, round, and reactive to light.  Neck: Normal range of motion. Neck supple.  No nuchal rigidity.  Cardiovascular: Normal rate, regular rhythm and normal heart sounds.   No murmur heard. Pulmonary/Chest: Effort normal and breath sounds  normal. No respiratory distress. She has no wheezes. She has no rales.  Abdominal: Soft. Bowel sounds are normal. She exhibits no distension. There is no tenderness. There is no rebound and no guarding.  Musculoskeletal: Normal range of motion.  Lymphadenopathy:    She has no cervical adenopathy.  Neurological: She is alert and oriented to person, place, and time. She has normal strength. No sensory deficit.  Skin: Skin is warm and dry.  Psychiatric: She has a normal mood and affect. Her  behavior is normal.    ED Course  Procedures (including critical care time) Labs Review Labs Reviewed  RAPID STREP SCREEN (NOT AT [facility])  CULTURE, GROUP A STREP    Imaging Review Dg Chest 2 View  10/31/2014   CLINICAL DATA:  Acute shortness of breath.  EXAM: CHEST  2 VIEW  COMPARISON:  June 06, 2014.  FINDINGS: The heart size and mediastinal contours are within normal limits. Both lungs are clear. No pneumothorax or pleural effusion is noted. The visualized skeletal structures are unremarkable.  IMPRESSION: No active cardiopulmonary disease.   Electronically Signed   By: Lupita Raider, M.D.   On: 10/31/2014 13:58   I have personally reviewed and evaluated these images and lab results as part of my medical decision-making.   EKG Interpretation None      MDM   Final diagnoses:  None    Patient presents with malaise and fever (102). Sick contacts.  After 650 mg tylenol, Blood pressure 111/60, pulse 92, temperature 99.6 F (37.6 C), temperature source Oral, resp. rate 20, SpO2 97 %.  Patient appears nontoxic, NAD.  On exam, she is nondiaphoretic.  No nuchal rigidity.  Lungs CTAB.   Suspect viral etiology.  Low suspicion for meningitis, PNA, UTI.  Imaging include CXR which was unremarkable.  Labs rapid strep. Pt stable for d/c.  Advised to follow up with PCP.  Discussed return precautions and supportive care.  Patient acknowledges and agrees with the above plan.  Case has been discussed with and seen by Dr. Anitra Lauth who agrees with the above plan for discharge.      Cheri Fowler, PA-C 10/31/14 1403  Gwyneth Sprout, MD 10/31/14 1701

## 2014-10-31 NOTE — ED Notes (Signed)
Patient transported to X-ray 

## 2014-11-03 LAB — CULTURE, GROUP A STREP

## 2014-12-26 ENCOUNTER — Encounter (HOSPITAL_COMMUNITY): Payer: Self-pay | Admitting: Emergency Medicine

## 2014-12-26 ENCOUNTER — Emergency Department (HOSPITAL_COMMUNITY)
Admission: EM | Admit: 2014-12-26 | Discharge: 2014-12-26 | Disposition: A | Discharge status: Home / Self Care | Payer: Medicaid Other | Attending: Emergency Medicine | Admitting: Emergency Medicine

## 2014-12-26 DIAGNOSIS — R011 Cardiac murmur, unspecified: Secondary | ICD-10-CM | POA: Insufficient documentation

## 2014-12-26 DIAGNOSIS — Z862 Personal history of diseases of the blood and blood-forming organs and certain disorders involving the immune mechanism: Secondary | ICD-10-CM | POA: Insufficient documentation

## 2014-12-26 DIAGNOSIS — F17210 Nicotine dependence, cigarettes, uncomplicated: Secondary | ICD-10-CM | POA: Diagnosis not present

## 2014-12-26 DIAGNOSIS — Z3202 Encounter for pregnancy test, result negative: Secondary | ICD-10-CM | POA: Diagnosis not present

## 2014-12-26 DIAGNOSIS — B9689 Other specified bacterial agents as the cause of diseases classified elsewhere: Secondary | ICD-10-CM

## 2014-12-26 DIAGNOSIS — Z880 Allergy status to penicillin: Secondary | ICD-10-CM | POA: Insufficient documentation

## 2014-12-26 DIAGNOSIS — Z202 Contact with and (suspected) exposure to infections with a predominantly sexual mode of transmission: Secondary | ICD-10-CM | POA: Insufficient documentation

## 2014-12-26 DIAGNOSIS — N898 Other specified noninflammatory disorders of vagina: Secondary | ICD-10-CM

## 2014-12-26 DIAGNOSIS — N760 Acute vaginitis: Secondary | ICD-10-CM | POA: Insufficient documentation

## 2014-12-26 LAB — URINALYSIS, ROUTINE W REFLEX MICROSCOPIC
BILIRUBIN URINE: NEGATIVE
Glucose, UA: NEGATIVE mg/dL
Ketones, ur: NEGATIVE mg/dL
NITRITE: NEGATIVE
PH: 7.5 (ref 5.0–8.0)
Protein, ur: NEGATIVE mg/dL
SPECIFIC GRAVITY, URINE: 1.018 (ref 1.005–1.030)

## 2014-12-26 LAB — WET PREP, GENITAL
Sperm: NONE SEEN
Trich, Wet Prep: NONE SEEN
YEAST WET PREP: NONE SEEN

## 2014-12-26 LAB — URINE MICROSCOPIC-ADD ON

## 2014-12-26 LAB — POC URINE PREG, ED: PREG TEST UR: NEGATIVE

## 2014-12-26 MED ORDER — CEFTRIAXONE SODIUM 250 MG IJ SOLR
250.0000 mg | Freq: Once | INTRAMUSCULAR | Status: AC
  Administered 2014-12-26: 250 mg via INTRAMUSCULAR
  Filled 2014-12-26: qty 250

## 2014-12-26 MED ORDER — AZITHROMYCIN 250 MG PO TABS
1000.0000 mg | ORAL_TABLET | Freq: Once | ORAL | Status: AC
  Administered 2014-12-26: 1000 mg via ORAL
  Filled 2014-12-26: qty 4

## 2014-12-26 MED ORDER — ONDANSETRON 4 MG PO TBDP
4.0000 mg | ORAL_TABLET | Freq: Once | ORAL | Status: AC
  Administered 2014-12-26: 4 mg via ORAL
  Filled 2014-12-26: qty 1

## 2014-12-26 MED ORDER — METRONIDAZOLE 500 MG PO TABS: 500.0000 mg | ORAL_TABLET | Freq: Two times a day (BID) | ORAL | Status: DC

## 2014-12-26 MED ORDER — LIDOCAINE HCL (PF) 1 % IJ SOLN
0.9000 mL | Freq: Once | INTRAMUSCULAR | Status: AC
  Administered 2014-12-26: 0.9 mL
  Filled 2014-12-26: qty 5

## 2014-12-26 NOTE — ED Notes (Signed)
Pt. requesting STD screening , reports vaginal discharge onset yesterday / unprotected sexual encounter , denies dysuria , no fever or chills.

## 2014-12-26 NOTE — ED Provider Notes (Signed)
History  By signing my name below, I, Karle Plumber, attest that this documentation has been prepared under the direction and in the presence of Marlon Pel, PA-C. Electronically Signed: Karle Plumber, ED Scribe. 12/26/2014. 9:15 PM.  Chief Complaint  Patient presents with  . SEXUALLY TRANSMITTED DISEASE   The history is provided by the patient and medical records. No language interpreter was used.    HPI Comments:  Jill Dennis is a 19 y.o. female who presents to the Emergency Department needing testing for an STD. She reports white vaginal discharge and abdominal pain that began yesterday pain is resolved mostly today. She reports associated pelvic itching. She states her child's father told her the woman he was having sex with reported she had been diagnosed with something unspecified. Pt has not done anything to treat her symptoms. She denies modifying factors of her symptoms. She denies fevers, chills, nausea, vomiting, dysuria or rash.   Past Medical History  Diagnosis Date  . Anemia 2013  . Heart murmur    Past Surgical History  Procedure Laterality Date  . No past surgeries     Family History  Problem Relation Age of Onset  . Hypertension Mother   . Diabetes Maternal Aunt   . Hypertension Maternal Aunt   . Thyroid disease Maternal Aunt   . Hypertension Maternal Uncle   . Diabetes Maternal Grandmother   . Diabetes Maternal Grandfather    Social History  Substance Use Topics  . Smoking status: Current Every Day Smoker    Types: Cigarettes  . Smokeless tobacco: None  . Alcohol Use: Yes     Comment: social   OB History    Gravida Para Term Preterm AB TAB SAB Ectopic Multiple Living   0 0 0 0 0 0 1     Review of Systems  Constitutional: Negative for fever and chills.  Gastrointestinal: Negative for nausea and vomiting.  Genitourinary: Positive for vaginal discharge. Negative for dysuria.  Skin: Negative for rash.  All other systems reviewed and  are negative.   Allergies  Penicillins; Sulfa antibiotics; and Sulfonamide derivatives  Home Medications   Prior to Admission medications   Medication Sig Start Date End Date Taking? Authorizing Provider  acetaminophen (TYLENOL) 500 MG tablet Take 1 tablet (500 mg total) by mouth every 6 (six) hours as needed. Patient not taking: Reported on 12/26/2014 10/31/14   Cheri Fowler, PA-C  erythromycin ophthalmic ointment Place 1 application into the right eye 4 (four) times daily. One-half inch (1.25 cm) four times daily for  7 days. No contacts x10 days. Patient not taking: Reported on 06/04/2014 06/28/13   Shelva Majestic, MD  ibuprofen (ADVIL,MOTRIN) 800 MG tablet Take 1 tablet (800 mg total) by mouth 3 (three) times daily. Patient not taking: Reported on 12/26/2014 10/31/14   Cheri Fowler, PA-C  metroNIDAZOLE (FLAGYL) 500 MG tablet Take 1 tablet (500 mg total) by mouth 2 (two) times daily. 12/26/14   Marlon Pel, PA-C  naproxen (NAPROSYN) 500 MG tablet Take 1 tablet (500 mg total) by mouth 2 (two) times daily. Patient not taking: Reported on 12/26/2014 08/17/14   Tatyana Kirichenko, PA-C  potassium chloride SA (K-DUR,KLOR-CON) 10 MEQ tablet Take 1 tablet (10 mEq total) by mouth daily. Patient not taking: Reported on 12/26/2014 06/07/14   Earley Favor, NP   Triage Vitals: BP 132/74 mmHg  Pulse 95  Temp(Src) 97.6 F (36.4 C) (Oral)  Resp 14  SpO2 100%  LMP 12/12/2014 (Approximate) Physical  Exam  Constitutional: She is oriented to person, place, and time. She appears well-developed and well-nourished.  HENT:  Head: Normocephalic and atraumatic.  Eyes: EOM are normal.  Neck: Normal range of motion.  Cardiovascular: Normal rate.   Pulmonary/Chest: Effort normal.  Genitourinary: Uterus normal. Cervix exhibits no motion tenderness, no discharge and no friability. Right adnexum displays no mass and no tenderness. Left adnexum displays no mass and no tenderness. No erythema, tenderness or bleeding  in the vagina. No foreign body around the vagina. No signs of injury around the vagina. Vaginal discharge (large amount of white/green discharge) found.  Musculoskeletal: Normal range of motion.  Neurological: She is alert and oriented to person, place, and time.  Skin: Skin is warm and dry.  Psychiatric: She has a normal mood and affect. Her behavior is normal.  Nursing note and vitals reviewed.   ED Course  Procedures (including critical care time) DIAGNOSTIC STUDIES: Oxygen Saturation is 100% on RA, normal by my interpretation.   COORDINATION OF CARE: 9:10 PM- Will perform pelvic exam and order STD panel. Pt verbalizes understanding and agrees to plan.  Medications  cefTRIAXone (ROCEPHIN) injection 250 mg (250 mg Intramuscular Given 12/26/14 2144)  ondansetron (ZOFRAN-ODT) disintegrating tablet 4 mg (4 mg Oral Given 12/26/14 2144)  azithromycin (ZITHROMAX) tablet 1,000 mg (1,000 mg Oral Given 12/26/14 2143)  lidocaine (PF) (XYLOCAINE) 1 % injection 0.9 mL (0.9 mLs Other Given 12/26/14 2144)    Labs Review Labs Reviewed  WET PREP, GENITAL - Abnormal; Notable for the following:    Clue Cells Wet Prep HPF POC PRESENT (*)    WBC, Wet Prep HPF POC MANY (*)    All other components within normal limits  URINALYSIS, ROUTINE W REFLEX MICROSCOPIC (NOT AT [facility]) - Abnormal; Notable for the following:    APPearance CLOUDY (*)    Hgb urine dipstick MODERATE (*)    Leukocytes, UA MODERATE (*)    All other components within normal limits  URINE MICROSCOPIC-ADD ON - Abnormal; Notable for the following:    Squamous Epithelial / LPF 6-30 (*)    Bacteria, UA MANY (*)    All other components within normal limits  URINE CULTURE  RPR  HIV ANTIBODY (ROUTINE TESTING)  POC URINE PREG, ED  GC/CHLAMYDIA PROBE AMP ([place]) NOT AT [facility]    Imaging Review No results found. I have personally reviewed and evaluated these images and lab results as part of my medical decision-making.   EKG  Interpretation None      MDM   Final diagnoses:  Vaginal discharge  STD exposure  Bacterial vaginosis    No tenderness on pelvic. GC, RPR and HIV pending. Advised to practice safe sex and have all partners evaluated and treated at the local health department. Also advised to follow with the health Department in 1-2 weeks to confirm effectiveness of treatment and receive additional education/evaluation. Return precautions given.  Medications  cefTRIAXone (ROCEPHIN) injection 250 mg (250 mg Intramuscular Given 12/26/14 2144)  ondansetron (ZOFRAN-ODT) disintegrating tablet 4 mg (4 mg Oral Given 12/26/14 2144)  azithromycin (ZITHROMAX) tablet 1,000 mg (1,000 mg Oral Given 12/26/14 2143)  lidocaine (PF) (XYLOCAINE) 1 % injection 0.9 mL (0.9 mLs Other Given 12/26/14 2144)    19 y.o.Jill Dennis's medical screening exam was performed and I feel the patient has had an appropriate workup for their chief complaint at this time and likelihood of emergent condition existing is low. They have been counseled on decision, discharge, follow up and which  symptoms necessitate immediate return to the emergency department. They or their family verbally stated understanding and agreement with plan and discharged in stable condition.   Vital signs are stable at discharge. Filed Vitals:   12/26/14 2012  BP: 132/74  Pulse: 95  Temp: 97.6 F (36.4 C)  Resp: [id number]       Amish Mintzer, PA-C 12/26/14 2220  Donnetta HutchingBrian Cook, MD 12/26/14 [phone]

## 2014-12-26 NOTE — Discharge Instructions (Signed)
Safe Sex Safe sex is about reducing the risk of giving or getting a sexually transmitted disease (STD). STDs are spread through sexual contact involving the genitals, mouth, or rectum. Some STDs can be cured and others cannot. Safe sex can also prevent unintended pregnancies.  WHAT ARE SOME SAFE SEX PRACTICES?  Limit your sexual activity to only one partner who is having sex with only you.  Talk to your partner about his or her past partners, past STDs, and drug use.  Use a condom every time you have sexual intercourse. This includes vaginal, oral, and anal sexual activity. Both females and males should wear condoms during oral sex. Only use latex or polyurethane condoms and water-based lubricants. Using petroleum-based lubricants or oils to lubricate a condom will weaken the condom and increase the chance that it will break. The condom should be in place from the beginning to the end of sexual activity. Wearing a condom reduces, but does not completely eliminate, your risk of getting or giving an STD. STDs can be spread by contact with infected body fluids and skin.  Get vaccinated for hepatitis B and HPV.  Avoid alcohol and recreational drugs, which can affect your judgment. You may forget to use a condom or participate in high-risk sex.  For females, avoid douching after sexual intercourse. Douching can spread an infection farther into the reproductive tract.  Check your body for signs of sores, blisters, rashes, or unusual discharge. See your health care provider if you notice any of these signs.  Avoid sexual contact if you have symptoms of an infection or are being treated for an STD. If you or your partner has herpes, avoid sexual contact when blisters are present. Use condoms at all other times.  If you are at risk of being infected with HIV, it is recommended that you take a prescription medicine daily to prevent HIV infection. This is called pre-exposure prophylaxis (PrEP). You are  considered at risk if:  You are a man who has sex with other men (MSM).  You are a heterosexual man or woman who is sexually active with more than one partner.  You take drugs by injection.  You are sexually active with a partner who has HIV.  Talk with your health care provider about whether you are at high risk of being infected with HIV. If you choose to begin PrEP, you should first be tested for HIV. You should then be tested every 3 months for as long as you are taking PrEP.  See your health care provider for regular screenings, exams, and tests for other STDs. Before having sex with a new partner, each of you should be screened for STDs and should talk about the results with each other. WHAT ARE THE BENEFITS OF SAFE SEX?   There is less chance of getting or giving an STD.  You can prevent unwanted or unintended pregnancies.  By discussing safe sex concerns with your partner, you may increase feelings of intimacy, comfort, trust, and honesty between the two of you.   This information is not intended to replace advice given to you by your health care provider. Make sure you discuss any questions you have with your health care provider.   Document Released: 03/06/2004 Document Revised: 02/17/2014 Document Reviewed: 07/21/2011 Elsevier Interactive Patient Education 2016 Elsevier Inc.   Bacterial Vaginosis Bacterial vaginosis is a vaginal infection that occurs when the normal balance of bacteria in the vagina is disrupted. It results from an overgrowth of certain bacteria.  This is the most common vaginal infection in women of childbearing age. Treatment is important to prevent complications, especially in pregnant women, as it can cause a premature delivery. CAUSES  Bacterial vaginosis is caused by an increase in harmful bacteria that are normally present in smaller amounts in the vagina. Several different kinds of bacteria can cause bacterial vaginosis. However, the reason that the  condition develops is not fully understood. RISK FACTORS Certain activities or behaviors can put you at an increased risk of developing bacterial vaginosis, including:  Having a new sex partner or multiple sex partners.  Douching.  Using an intrauterine device (IUD) for contraception. Women do not get bacterial vaginosis from toilet seats, bedding, swimming pools, or contact with objects around them. SIGNS AND SYMPTOMS  Some women with bacterial vaginosis have no signs or symptoms. Common symptoms include:  Grey vaginal discharge.  A fishlike odor with discharge, especially after sexual intercourse.  Itching or burning of the vagina and vulva.  Burning or pain with urination. DIAGNOSIS  Your health care provider will take a medical history and examine the vagina for signs of bacterial vaginosis. A sample of vaginal fluid may be taken. Your health care provider will look at this sample under a microscope to check for bacteria and abnormal cells. A vaginal pH test may also be done.  TREATMENT  Bacterial vaginosis may be treated with antibiotic medicines. These may be given in the form of a pill or a vaginal cream. A second round of antibiotics may be prescribed if the condition comes back after treatment. Because bacterial vaginosis increases your risk for sexually transmitted diseases, getting treated can help reduce your risk for chlamydia, gonorrhea, HIV, and herpes. HOME CARE INSTRUCTIONS   Only take over-the-counter or prescription medicines as directed by your health care provider.  If antibiotic medicine was prescribed, take it as directed. Make sure you finish it even if you start to feel better.  Tell all sexual partners that you have a vaginal infection. They should see their health care provider and be treated if they have problems, such as a mild rash or itching.  During treatment, it is important that you follow these instructions:  Avoid sexual activity or use condoms  correctly.  Do not douche.  Avoid alcohol as directed by your health care provider.  Avoid breastfeeding as directed by your health care provider. SEEK MEDICAL CARE IF:   Your symptoms are not improving after 3 days of treatment.  You have increased discharge or pain.  You have a fever. MAKE SURE YOU:   Understand these instructions.  Will watch your condition.  Will get help right away if you are not doing well or get worse. FOR MORE INFORMATION  Centers for Disease Control and Prevention, Division of STD Prevention: [employer]s.[employer] American Sexual Health Association (ASHA): www.ashastd.org    This information is not intended to replace advice given to you by your health care provider. Make sure you discuss any questions you have with your health care provider.   Document Released: 01/27/2005 Document Revised: 02/17/2014 Document Reviewed: 09/08/2012 Elsevier Interactive Patient Education [employer]

## 2014-12-27 LAB — RPR: RPR Ser Ql: NONREACTIVE

## 2014-12-27 LAB — GC/CHLAMYDIA PROBE AMP (~~LOC~~) NOT AT ARMC
CHLAMYDIA, DNA PROBE: POSITIVE — AB
Neisseria Gonorrhea: NEGATIVE

## 2014-12-27 LAB — HIV ANTIBODY (ROUTINE TESTING W REFLEX): HIV SCREEN 4TH GENERATION: NONREACTIVE

## 2014-12-28 ENCOUNTER — Telehealth (HOSPITAL_BASED_OUTPATIENT_CLINIC_OR_DEPARTMENT_OTHER): Payer: Self-pay | Admitting: Emergency Medicine

## 2014-12-28 LAB — URINE CULTURE: Special Requests: NORMAL

## 2015-05-20 DIAGNOSIS — R011 Cardiac murmur, unspecified: Secondary | ICD-10-CM | POA: Diagnosis not present

## 2015-05-20 DIAGNOSIS — N760 Acute vaginitis: Secondary | ICD-10-CM | POA: Diagnosis not present

## 2015-05-20 DIAGNOSIS — Z792 Long term (current) use of antibiotics: Secondary | ICD-10-CM | POA: Insufficient documentation

## 2015-05-20 DIAGNOSIS — Z202 Contact with and (suspected) exposure to infections with a predominantly sexual mode of transmission: Secondary | ICD-10-CM | POA: Diagnosis present

## 2015-05-20 DIAGNOSIS — Z3202 Encounter for pregnancy test, result negative: Secondary | ICD-10-CM | POA: Diagnosis not present

## 2015-05-20 DIAGNOSIS — B373 Candidiasis of vulva and vagina: Secondary | ICD-10-CM | POA: Insufficient documentation

## 2015-05-20 DIAGNOSIS — Z862 Personal history of diseases of the blood and blood-forming organs and certain disorders involving the immune mechanism: Secondary | ICD-10-CM | POA: Insufficient documentation

## 2015-05-20 DIAGNOSIS — Z880 Allergy status to penicillin: Secondary | ICD-10-CM | POA: Insufficient documentation

## 2015-05-20 DIAGNOSIS — F17210 Nicotine dependence, cigarettes, uncomplicated: Secondary | ICD-10-CM | POA: Insufficient documentation

## 2015-05-20 NOTE — ED Notes (Signed)
The pt wants to be checked for all stds  She has had a vaginal discharge for 2 weeks.  lmp  March 22nd

## 2015-05-21 ENCOUNTER — Emergency Department (HOSPITAL_COMMUNITY)
Admission: EM | Admit: 2015-05-21 | Discharge: 2015-05-21 | Disposition: A | Discharge status: Home / Self Care | Payer: Medicaid Other | Attending: Emergency Medicine | Admitting: Emergency Medicine

## 2015-05-21 ENCOUNTER — Encounter (HOSPITAL_COMMUNITY): Payer: Self-pay | Admitting: *Deleted

## 2015-05-21 DIAGNOSIS — B9689 Other specified bacterial agents as the cause of diseases classified elsewhere: Secondary | ICD-10-CM

## 2015-05-21 DIAGNOSIS — N760 Acute vaginitis: Secondary | ICD-10-CM

## 2015-05-21 DIAGNOSIS — B379 Candidiasis, unspecified: Secondary | ICD-10-CM

## 2015-05-21 LAB — URINE MICROSCOPIC-ADD ON

## 2015-05-21 LAB — HIV ANTIBODY (ROUTINE TESTING W REFLEX): HIV SCREEN 4TH GENERATION: NONREACTIVE

## 2015-05-21 LAB — WET PREP, GENITAL
Sperm: NONE SEEN
Trich, Wet Prep: NONE SEEN

## 2015-05-21 LAB — CBC
HCT: 37 % (ref 36.0–46.0)
Hemoglobin: 11.7 g/dL — ABNORMAL LOW (ref 12.0–15.0)
MCH: 28.9 pg (ref 26.0–34.0)
MCHC: 31.6 g/dL (ref 30.0–36.0)
MCV: 91.4 fL (ref 78.0–100.0)
PLATELETS: 258 10*3/uL (ref 150–400)
RBC: 4.05 MIL/uL (ref 3.87–5.11)
RDW: 12.8 % (ref 11.5–15.5)
WBC: 5.8 10*3/uL (ref 4.0–10.5)

## 2015-05-21 LAB — URINALYSIS, ROUTINE W REFLEX MICROSCOPIC
Bilirubin Urine: NEGATIVE
GLUCOSE, UA: NEGATIVE mg/dL
KETONES UR: NEGATIVE mg/dL
LEUKOCYTES UA: NEGATIVE
NITRITE: NEGATIVE
PROTEIN: NEGATIVE mg/dL
Specific Gravity, Urine: 1.027 (ref 1.005–1.030)
pH: 6.5 (ref 5.0–8.0)

## 2015-05-21 LAB — COMPREHENSIVE METABOLIC PANEL
ALT: 15 U/L (ref 14–54)
AST: 20 U/L (ref 15–41)
Albumin: 4.1 g/dL (ref 3.5–5.0)
Alkaline Phosphatase: 42 U/L (ref 38–126)
Anion gap: 11 (ref 5–15)
BILIRUBIN TOTAL: 0.2 mg/dL — AB (ref 0.3–1.2)
BUN: 16 mg/dL (ref 6–20)
CALCIUM: 9.5 mg/dL (ref 8.9–10.3)
CHLORIDE: 107 mmol/L (ref 101–111)
CO2: 21 mmol/L — ABNORMAL LOW (ref 22–32)
CREATININE: 0.64 mg/dL (ref 0.44–1.00)
Glucose, Bld: 94 mg/dL (ref 65–99)
Potassium: 3.5 mmol/L (ref 3.5–5.1)
Sodium: 139 mmol/L (ref 135–145)
TOTAL PROTEIN: 8 g/dL (ref 6.5–8.1)

## 2015-05-21 LAB — GC/CHLAMYDIA PROBE AMP (~~LOC~~) NOT AT ARMC
Chlamydia: POSITIVE — AB
NEISSERIA GONORRHEA: NEGATIVE

## 2015-05-21 LAB — PREGNANCY, URINE: PREG TEST UR: NEGATIVE

## 2015-05-21 LAB — LIPASE, BLOOD: LIPASE: 26 U/L (ref 11–51)

## 2015-05-21 LAB — RPR: RPR: NONREACTIVE

## 2015-05-21 MED ORDER — ONDANSETRON 4 MG PO TBDP: 4.0000 mg | ORAL_TABLET | Freq: Three times a day (TID) | ORAL | Status: DC | PRN

## 2015-05-21 MED ORDER — ONDANSETRON 4 MG PO TBDP
4.0000 mg | ORAL_TABLET | Freq: Once | ORAL | Status: AC
  Administered 2015-05-21: 4 mg via ORAL
  Filled 2015-05-21: qty 1

## 2015-05-21 MED ORDER — LIDOCAINE HCL (PF) 1 % IJ SOLN
5.0000 mL | Freq: Once | INTRAMUSCULAR | Status: AC
  Administered 2015-05-21: 5 mL
  Filled 2015-05-21: qty 5

## 2015-05-21 MED ORDER — FLUCONAZOLE 100 MG PO TABS
150.0000 mg | ORAL_TABLET | Freq: Once | ORAL | Status: AC
  Administered 2015-05-21: 150 mg via ORAL
  Filled 2015-05-21: qty 2

## 2015-05-21 MED ORDER — AZITHROMYCIN 250 MG PO TABS
1000.0000 mg | ORAL_TABLET | Freq: Once | ORAL | Status: AC
  Administered 2015-05-21: 1000 mg via ORAL
  Filled 2015-05-21: qty 4

## 2015-05-21 MED ORDER — METRONIDAZOLE 500 MG PO TABS
500.0000 mg | ORAL_TABLET | Freq: Once | ORAL | Status: AC
  Administered 2015-05-21: 500 mg via ORAL
  Filled 2015-05-21: qty 1

## 2015-05-21 MED ORDER — METRONIDAZOLE 500 MG PO TABS: 500.0000 mg | ORAL_TABLET | Freq: Two times a day (BID) | ORAL | Status: DC

## 2015-05-21 MED ORDER — CEFTRIAXONE SODIUM 250 MG IJ SOLR
250.0000 mg | Freq: Once | INTRAMUSCULAR | Status: AC
  Administered 2015-05-21: 250 mg via INTRAMUSCULAR
  Filled 2015-05-21: qty 250

## 2015-05-21 NOTE — Discharge Instructions (Signed)
We have treated today for gonorrhea, chlamydia, yeast and he are discharging you with prescription for antibiotics for bacterial vaginosis. Please do not have sexual intercourse until all of your STD screening has returned. If you are positive for any of these tests, you'll be contacted and your partner will need to be treated as well. If you are positive for any sexual transmitted diseases, both you and your partner will need to be treated and then wait at least 1 week before any sexual intercourse (oral, vaginal, anal).   Bacterial Vaginosis Bacterial vaginosis is a vaginal infection that occurs when the normal balance of bacteria in the vagina is disrupted. It results from an overgrowth of certain bacteria. This is the most common vaginal infection in women of childbearing age. Treatment is important to prevent complications, especially in pregnant women, as it can cause a premature delivery. CAUSES  Bacterial vaginosis is caused by an increase in harmful bacteria that are normally present in smaller amounts in the vagina. Several different kinds of bacteria can cause bacterial vaginosis. However, the reason that the condition develops is not fully understood. RISK FACTORS Certain activities or behaviors can put you at an increased risk of developing bacterial vaginosis, including:  Having a new sex partner or multiple sex partners.  Douching.  Using an intrauterine device (IUD) for contraception. Women do not get bacterial vaginosis from toilet seats, bedding, swimming pools, or contact with objects around them. SIGNS AND SYMPTOMS  Some women with bacterial vaginosis have no signs or symptoms. Common symptoms include:  Grey vaginal discharge.  A fishlike odor with discharge, especially after sexual intercourse.  Itching or burning of the vagina and vulva.  Burning or pain with urination. DIAGNOSIS  Your health care provider will take a medical history and examine the vagina for signs  of bacterial vaginosis. A sample of vaginal fluid may be taken. Your health care provider will look at this sample under a microscope to check for bacteria and abnormal cells. A vaginal pH test may also be done.  TREATMENT  Bacterial vaginosis may be treated with antibiotic medicines. These may be given in the form of a pill or a vaginal cream. A second round of antibiotics may be prescribed if the condition comes back after treatment. Because bacterial vaginosis increases your risk for sexually transmitted diseases, getting treated can help reduce your risk for chlamydia, gonorrhea, HIV, and herpes. HOME CARE INSTRUCTIONS   Only take over-the-counter or prescription medicines as directed by your health care provider.  If antibiotic medicine was prescribed, take it as directed. Make sure you finish it even if you start to feel better.  Tell all sexual partners that you have a vaginal infection. They should see their health care provider and be treated if they have problems, such as a mild rash or itching.  During treatment, it is important that you follow these instructions:  Avoid sexual activity or use condoms correctly.  Do not douche.  Avoid alcohol as directed by your health care provider.  Avoid breastfeeding as directed by your health care provider. SEEK MEDICAL CARE IF:   Your symptoms are not improving after 3 days of treatment.  You have increased discharge or pain.  You have a fever. MAKE SURE YOU:   Understand these instructions.  Will watch your condition.  Will get help right away if you are not doing well or get worse. FOR MORE INFORMATION  Centers for Disease Control and Prevention, Division of STD Prevention: SolutionApps.co.za American Sexual  Health Association (ASHA): www.ashastd.org    This information is not intended to replace advice given to you by your health care provider. Make sure you discuss any questions you have with your health care provider.     Document Released: 01/27/2005 Document Revised: 02/17/2014 Document Reviewed: 09/08/2012 Elsevier Interactive Patient Education 2016 [employer].  Vaginitis Vaginitis is an inflammation of the vagina. It is most often caused by a change in the normal balance of the bacteria and yeast that live in the vagina. This change in balance causes an overgrowth of certain bacteria or yeast, which causes the inflammation. There are different types of vaginitis, but the most common types are:  Bacterial vaginosis.  Yeast infection (candidiasis).  Trichomoniasis vaginitis. This is a sexually transmitted infection (STI).  Viral vaginitis.  Atrophic vaginitis.  Allergic vaginitis. CAUSES  The cause depends on the type of vaginitis. Vaginitis can be caused by:  Bacteria (bacterial vaginosis).  Yeast (yeast infection).  A parasite (trichomoniasis vaginitis)  A virus (viral vaginitis).  Low hormone levels (atrophic vaginitis). Low hormone levels can occur during pregnancy, breastfeeding, or after menopause.  Irritants, such as bubble baths, scented tampons, and feminine sprays (allergic vaginitis). Other factors can change the normal balance of the yeast and bacteria that live in the vagina. These include:  Antibiotic medicines.  Poor hygiene.  Diaphragms, vaginal sponges, spermicides, birth control pills, and intrauterine devices (IUD).  Sexual intercourse.  Infection.  Uncontrolled diabetes.  A weakened immune system. SYMPTOMS  Symptoms can vary depending on the cause of the vaginitis. Common symptoms include:  Abnormal vaginal discharge.  The discharge is white, gray, or yellow with bacterial vaginosis.  The discharge is thick, white, and cheesy with a yeast infection.  The discharge is frothy and yellow or greenish with trichomoniasis.  A bad vaginal odor.  The odor is fishy with bacterial vaginosis.  Vaginal itching, pain, or swelling.  Painful  intercourse.  Pain or burning when urinating. Sometimes, there are no symptoms. TREATMENT  Treatment will vary depending on the type of infection.   Bacterial vaginosis and trichomoniasis are often treated with antibiotic creams or pills.  Yeast infections are often treated with antifungal medicines, such as vaginal creams or suppositories.  Viral vaginitis has no cure, but symptoms can be treated with medicines that relieve discomfort. Your sexual partner should be treated as well.  Atrophic vaginitis may be treated with an estrogen cream, pill, suppository, or vaginal ring. If vaginal dryness occurs, lubricants and moisturizing creams may help. You may be told to avoid scented soaps, sprays, or douches.  Allergic vaginitis treatment involves quitting the use of the product that is causing the problem. Vaginal creams can be used to treat the symptoms. HOME CARE INSTRUCTIONS   Take all medicines as directed by your caregiver.  Keep your genital area clean and dry. Avoid soap and only rinse the area with water.  Avoid douching. It can remove the healthy bacteria in the vagina.  Do not use tampons or have sexual intercourse until your vaginitis has been treated. Use sanitary pads while you have vaginitis.  Wipe from front to back. This avoids the spread of bacteria from the rectum to the vagina.  Let air reach your genital area.  Wear cotton underwear to decrease moisture buildup.  Avoid wearing underwear while you sleep until your vaginitis is gone.  Avoid tight pants and underwear or nylons without a cotton panel.  Take off wet clothing (especially bathing suits) as soon as possible.  Use mild, non-scented products. Avoid using irritants, such as:  Scented feminine sprays.  Fabric softeners.  Scented detergents.  Scented tampons.  Scented soaps or bubble baths.  Practice safe sex and use condoms. Condoms may prevent the spread of trichomoniasis and viral  vaginitis. SEEK MEDICAL CARE IF:   You have abdominal pain.  You have a fever or persistent symptoms for more than 2-3 days.  You have a fever and your symptoms suddenly get worse.   This information is not intended to replace advice given to you by your health care provider. Make sure you discuss any questions you have with your health care provider.   Document Released: 11/24/2006 Document Revised: 06/13/2014 Document Reviewed: 07/10/2011 Elsevier Interactive Patient Education 2016 [employer]    [facility] [email or web address] [address] [place], [place] [phone]    [facility] [email or web address] [address] [place], [place] [phone]    [facility] [address] [place], [place] [phone]   [facility] [email or web address] [address] [place], [place] [phone]   [facility] [email or web address] [address] [place], [place] [phone]   [facility] [email or web address] [address] [place], [place] [phone]    To find a primary care or specialty doctor please call [phone] or [phone] to access "[facility] Find a Doctor Service."  You may also go on the [facility] website at [email or web address]  There are also multiple [facility] practices throughout the [place] that are frequently accepting new patients. You may find a clinic that is close to your home and contact them.  [facility] -  [address] [place] [phone]  [facility] in [place] (also locations in [place] and [place]) -  [address] [place] [address] [phone]  [facility] -  [address] [place] [address] [phone]

## 2015-05-21 NOTE — ED Notes (Signed)
No sign or symptom of reaction to rocephin injection.  Awaiting discharge.

## 2015-05-21 NOTE — ED Provider Notes (Signed)
By signing my name below, I, Lorenza Chick, attest that this documentation has been prepared under the direction and in the presence of Raelyn Number, DO Electronically Signed: Lorenza Chick, ED Scribe. 05/21/2015. 2:17 AM.    TIME SEEN: 1:54 AM   CHIEF COMPLAINT:  Chief Complaint  Patient presents with  . Exposure to STD    HPI: HPI Comments: Jill Dennis is a 20 y.o. female who presents to the Emergency Department complaining of "light yellow" malodorous vaginal discharge for 1 week. She denies nausea, vomiting, diarrhea, hematuria and dysuria; states she has no pain at this time. Pt has a pmhx of gonorrhea in October 2016; states she has not hax sexual intercourse with that partner since being treated. Pt is currently sexually active with a single partner and they do not use contraception of any kind.  LNMP was 05/03/15. G1/P1/A0.  ROS: See HPI Constitutional: no fever  Eyes: no drainage  ENT: no runny nose   Cardiovascular:  no chest pain  Resp: no SOB  GI: no vomiting GU: no dysuria Integumentary: no rash  Allergy: no hives  Musculoskeletal: no leg swelling  Neurological: no slurred speech ROS otherwise negative  PAST MEDICAL HISTORY/PAST SURGICAL HISTORY:  Past Medical History  Diagnosis Date  . Anemia 2013  . Heart murmur     MEDICATIONS:  Prior to Admission medications   Medication Sig Start Date End Date Taking? Authorizing Provider  acetaminophen (TYLENOL) 500 MG tablet Take 1 tablet (500 mg total) by mouth every 6 (six) hours as needed. Patient not taking: Reported on 12/26/2014 10/31/14   Cheri Fowler, PA-C  erythromycin ophthalmic ointment Place 1 application into the right eye 4 (four) times daily. One-half inch (1.25 cm) four times daily for  7 days. No contacts x10 days. Patient not taking: Reported on 06/04/2014 06/28/13   Shelva Majestic, MD  ibuprofen (ADVIL,MOTRIN) 800 MG tablet Take 1 tablet (800 mg total) by mouth 3 (three) times daily. Patient not  taking: Reported on 12/26/2014 10/31/14   Cheri Fowler, PA-C  metroNIDAZOLE (FLAGYL) 500 MG tablet Take 1 tablet (500 mg total) by mouth 2 (two) times daily. 12/26/14   Marlon Pel, PA-C  naproxen (NAPROSYN) 500 MG tablet Take 1 tablet (500 mg total) by mouth 2 (two) times daily. Patient not taking: Reported on 12/26/2014 08/17/14   Tatyana Kirichenko, PA-C  potassium chloride SA (K-DUR,KLOR-CON) 10 MEQ tablet Take 1 tablet (10 mEq total) by mouth daily. Patient not taking: Reported on 12/26/2014 06/07/14   Earley Favor, NP    ALLERGIES:  Allergies  Allergen Reactions  . Penicillins Rash    Has patient had a PCN reaction causing immediate rash, facial/tongue/throat swelling, SOB or lightheadedness with hypotension: {Yes Has patient had a PCN reaction causing severe rash involving mucus membranes or skin necrosis: no Has patient had a PCN reaction that required hospitalization NO Has patient had a PCN reaction occurring within the last 10 years: no If all of the above answers are "NO", then may proceed with Cephalosporin use.  . Sulfa Antibiotics Rash  . Sulfonamide Derivatives Rash    SOCIAL HISTORY:  Social History  Substance Use Topics  . Smoking status: Current Every Day Smoker    Types: Cigarettes  . Smokeless tobacco: Not on file  . Alcohol Use: Yes     Comment: social    FAMILY HISTORY: Family History  Problem Relation Age of Onset  . Hypertension Mother   . Diabetes Maternal Aunt   .  Hypertension Maternal Aunt   . Thyroid disease Maternal Aunt   . Hypertension Maternal Uncle   . Diabetes Maternal Grandmother   . Diabetes Maternal Grandfather     EXAM: BP 127/72 mmHg  Pulse 90  Temp(Src) 98.3 F (36.8 C) (Oral)  Resp 20  Ht  (1.651 m)  Wt 162 lb 8 oz (73.71 kg)  BMI 27.04 kg/m2  SpO2 98%  LMP 05/03/2015 CONSTITUTIONAL: Alert and oriented and responds appropriately to questions. Well-appearing; well-nourished HEAD: Normocephalic EYES: Conjunctivae clear,  PERRL ENT: normal nose; no rhinorrhea; moist mucous membranes NECK: Supple, no meningismus, no LAD  CARD: RRR; S1 and S2 appreciated; no murmurs, no clicks, no rubs, no gallops RESP: Normal chest excursion without splinting or tachypnea; breath sounds clear and equal bilaterally; no wheezes, no rhonchi, no rales, no hypoxia or respiratory distress, speaking full sentences ABD/GI: Normal bowel sounds; non-distended; soft, non-tender, no rebound, no guarding, no peritoneal signs GU:  Normal external genitalia. No lesions, rashes noted. Patient has no vaginal bleeding on exam. Patient has yellow foul-smelling vaginal discharge.  No adnexal tenderness, mass or fullness, no cervical motion tenderness. Cervix is not appear friable.  Cervix is closed.  Chaperone present for exam. BACK:  The back appears normal and is non-tender to palpation, there is no CVA tenderness EXT: Normal ROM in all joints; non-tender to palpation; no edema; normal capillary refill; no cyanosis, no calf tenderness or swelling    SKIN: Normal color for age and race; warm; no rash NEURO: Moves all extremities equally, sensation to light touch intact diffusely, cranial nerves II through XII intact PSYCH: The patient's mood and manner are appropriate. Grooming and personal hygiene are appropriate.  MEDICAL DECISION MAKING: Patient here with vaginal discharge for the past week. Otherwise asymptomatic. No signs of cervicitis on exam. I'm not concerned for PID, TOA. Other than discharge, pelvic exam is unremarkable. She is not having pain. She is requesting testing for sexual transmitted diseases. Given her yellow discharge we have decided together to empirically treat for gonorrhea and chlamydia. She will receive ceftriaxone and azithromycin. Pregnancy test is negative. Urine shows blood but no other sign of infection. She denies any urinary symptoms.  Wet prep is positive for yeast and clue cells. No trichomonas. We have given her  Diflucan in the emergency department and will discharge with Flagyl given she is symptomatic. STD screen is pending. Will give PCP and OB/GYN follow-up. Have advised her to avoid sexual intercourse until her results are back and if she is positive for a finger her partner will need to be tested and treated as well and they will both be to avoid sexual intercourse for at least 1 week until both partners have been treated. Discussed return precautions.   At this time, I do not feel there is any life-threatening condition present. I have reviewed and discussed all results (EKG, imaging, lab, urine as appropriate), exam findings with patient. I have reviewed nursing notes and appropriate previous records.  I feel the patient is safe to be discharged home without further emergent workup. Discussed usual and customary return precautions. Patient and family (if present) verbalize understanding and are comfortable with this plan.  Patient will follow-up with their primary care provider. If they do not have a primary care provider, information for follow-up has been provided to them. All questions have been answered.  I personally performed the services described in this documentation, which was scribed in my presence. The recorded information has been reviewed and  is accurate.    Layla MawKristen N Irving Bloor, DO 05/21/15 [phone]

## 2015-05-24 ENCOUNTER — Telehealth (HOSPITAL_BASED_OUTPATIENT_CLINIC_OR_DEPARTMENT_OTHER): Payer: Self-pay | Admitting: Emergency Medicine

## 2015-06-17 ENCOUNTER — Emergency Department (HOSPITAL_COMMUNITY)
Admission: EM | Admit: 2015-06-17 | Discharge: 2015-06-17 | Disposition: A | Discharge status: Home / Self Care | Payer: Medicaid Other | Attending: Emergency Medicine | Admitting: Emergency Medicine

## 2015-06-17 ENCOUNTER — Encounter (HOSPITAL_COMMUNITY): Payer: Self-pay | Admitting: *Deleted

## 2015-06-17 DIAGNOSIS — N938 Other specified abnormal uterine and vaginal bleeding: Secondary | ICD-10-CM | POA: Diagnosis not present

## 2015-06-17 DIAGNOSIS — F17210 Nicotine dependence, cigarettes, uncomplicated: Secondary | ICD-10-CM | POA: Diagnosis not present

## 2015-06-17 LAB — URINALYSIS, ROUTINE W REFLEX MICROSCOPIC
Bilirubin Urine: NEGATIVE
Glucose, UA: NEGATIVE mg/dL
Ketones, ur: NEGATIVE mg/dL
Leukocytes, UA: NEGATIVE
NITRITE: NEGATIVE
PH: 6.5 (ref 5.0–8.0)
PROTEIN: NEGATIVE mg/dL
SPECIFIC GRAVITY, URINE: 1.026 (ref 1.005–1.030)

## 2015-06-17 LAB — URINE MICROSCOPIC-ADD ON: WBC UA: NONE SEEN WBC/hpf (ref 0–5)

## 2015-06-17 LAB — POC URINE PREG, ED: PREG TEST UR: NEGATIVE

## 2015-06-17 NOTE — ED Notes (Signed)
Tiffany G PA aware that pt left AMA.

## 2015-06-17 NOTE — ED Notes (Signed)
PT not available to obtain vital signs or pain assessment. PT not available to go over paper work or go over [employer] paperwork or signature.

## 2015-06-17 NOTE — ED Notes (Signed)
Pelvic cart at bedside. 

## 2015-06-17 NOTE — ED Notes (Signed)
Patient presents stating she got out of the shower and noticed she started spotting.  Stated she was here and dx with gonorrhea or chylamidia

## 2015-06-17 NOTE — ED Notes (Signed)
Pt left AMA. 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

## 2015-06-17 NOTE — ED Provider Notes (Signed)
Patient left AMA because she did not want to wait any longer and needed to go to work.  Labs Reviewed  URINALYSIS, ROUTINE W REFLEX MICROSCOPIC (NOT AT [facility]) - Abnormal; Notable for the following:    Hgb urine dipstick MODERATE (*)    All other components within normal limits  URINE MICROSCOPIC-ADD ON - Abnormal; Notable for the following:    Squamous Epithelial / LPF 0-5 (*)    Bacteria, UA RARE (*)    All other components within normal limits  WET PREP, GENITAL  POC URINE PREG, ED  GC/CHLAMYDIA PROBE AMP ([place]) NOT AT [facility]    A pelvic was not done. But she did have a urinalysis that showed hgb but no infection. I was notified after the fact and did not see the patient during her visit.  Marlon Peliffany Che[place] Filice, PA-C 06/17/15 0302  Layla MawKristen N Ward, DO 06/17/15 [phone]

## 2015-07-17 ENCOUNTER — Other Ambulatory Visit (HOSPITAL_COMMUNITY)
Admission: RE | Admit: 2015-07-17 | Discharge: 2015-07-17 | Disposition: A | Discharge status: Home / Self Care | Payer: Medicaid Other | Source: Ambulatory Visit | Attending: Family Medicine | Admitting: Family Medicine

## 2015-07-17 ENCOUNTER — Encounter: Payer: Self-pay | Admitting: Family Medicine

## 2015-07-17 ENCOUNTER — Ambulatory Visit (INDEPENDENT_AMBULATORY_CARE_PROVIDER_SITE_OTHER): Payer: Medicaid Other | Admitting: Family Medicine

## 2015-07-17 VITALS — BP 118/66 | HR 82 | Temp 98.7°F | Wt 167.6 lb

## 2015-07-17 DIAGNOSIS — Z113 Encounter for screening for infections with a predominantly sexual mode of transmission: Secondary | ICD-10-CM | POA: Diagnosis not present

## 2015-07-17 DIAGNOSIS — Z7251 High risk heterosexual behavior: Secondary | ICD-10-CM

## 2015-07-17 DIAGNOSIS — Z01411 Encounter for gynecological examination (general) (routine) with abnormal findings: Secondary | ICD-10-CM | POA: Insufficient documentation

## 2015-07-17 DIAGNOSIS — N760 Acute vaginitis: Secondary | ICD-10-CM | POA: Insufficient documentation

## 2015-07-17 DIAGNOSIS — Z124 Encounter for screening for malignant neoplasm of cervix: Secondary | ICD-10-CM

## 2015-07-17 DIAGNOSIS — Z1151 Encounter for screening for human papillomavirus (HPV): Secondary | ICD-10-CM | POA: Insufficient documentation

## 2015-07-17 LAB — POCT WET PREP (WET MOUNT): Clue Cells Wet Prep Whiff POC: POSITIVE

## 2015-07-17 LAB — POCT URINE PREGNANCY: PREG TEST UR: NEGATIVE

## 2015-07-17 MED ORDER — CEFTRIAXONE SODIUM 1 G IJ SOLR
250.0000 mg | Freq: Once | INTRAMUSCULAR | Status: AC
  Administered 2015-07-17: 250 mg via INTRAMUSCULAR

## 2015-07-17 MED ORDER — AZITHROMYCIN 500 MG PO TABS
1000.0000 mg | ORAL_TABLET | Freq: Once | ORAL | Status: AC
Start: 2015-07-17 — End: 2015-07-17
  Administered 2015-07-17: 1000 mg via ORAL

## 2015-07-17 NOTE — Progress Notes (Signed)
Date of Visit: 07/17/2015   HPI:  Patient presents for a same day appointment to discuss STD testing.   Tested positive for chlamydia in April of this year and November 2016. Was treated. Partner was treated as well and was tested, and was negative. Does not use condoms every time, just occasionally. Is not on birth control. Does not desire pregnancy at this time. LMP May 20. Wants to be retested for STDs because she's been having odor and discharge. No pelvic pain.  Patient prefers to be treated for both gonorrhea and chlamydia while she is here in the clinic.  ROS: See HPI  PMFSH: noncontributory, see HPI  PHYSICAL EXAM: BP 118/66 mmHg  Pulse 82  Temp(Src) 98.7 F (37.1 C) (Oral)  Wt 167 lb 9.6 oz (76.023 kg)  LMP 05/30/2015 Gen: NAD, pleasant, cooperative GU: normal appearing external genitalia without lesions. Vagina is moist with thin discharge. Cervix normal in appearance. No cervical motion tenderness or tenderness on bimanual exam. No adnexal masses.   ASSESSMENT/PLAN:  1. STD screening:  - will check gc/chlamydia/trichomonas, HIV, RPR, acute hepatitis panel today, wetprep - handout on safe sex. Supply of condoms given - discussed contraception choices with patient - handout given, she will consider - updating pap smear today (first pap as she is now 20)  FOLLOW UP: Follow up as needed if symptoms worsen or fail to improve.    [place]adaBrittany J. Pollie MeyerMcIntyre, MD [facility]

## 2015-07-17 NOTE — Patient Instructions (Addendum)
Testing you for STDs today I will call you with your results  We are treating you today for gonorrhea and chlamydia.  Also doing pap smear today (test for cervical cancer)  Be well, Dr. Randye Lobo Sex Safe sex is about reducing the risk of giving or getting a sexually transmitted disease (STD). STDs are spread through sexual contact involving the genitals, mouth, or rectum. Some STDs can be cured and others cannot. Safe sex can also prevent unintended pregnancies.  WHAT ARE SOME SAFE SEX PRACTICES?  Limit your sexual activity to only one partner who is having sex with only you.  Talk to your partner about his or her past partners, past STDs, and drug use.  Use a condom every time you have sexual intercourse. This includes vaginal, oral, and anal sexual activity. Both females and males should wear condoms during oral sex. Only use latex or polyurethane condoms and water-based lubricants. Using petroleum-based lubricants or oils to lubricate a condom will weaken the condom and increase the chance that it will break. The condom should be in place from the beginning to the end of sexual activity. Wearing a condom reduces, but does not completely eliminate, your risk of getting or giving an STD. STDs can be spread by contact with infected body fluids and skin.  Get vaccinated for hepatitis B and HPV.  Avoid alcohol and recreational drugs, which can affect your judgment. You may forget to use a condom or participate in high-risk sex.  For females, avoid douching after sexual intercourse. Douching can spread an infection farther into the reproductive tract.  Check your body for signs of sores, blisters, rashes, or unusual discharge. See your health care provider if you notice any of these signs.  Avoid sexual contact if you have symptoms of an infection or are being treated for an STD. If you or your partner has herpes, avoid sexual contact when blisters are present. Use condoms at all other  times.  If you are at risk of being infected with HIV, it is recommended that you take a prescription medicine daily to prevent HIV infection. This is called pre-exposure prophylaxis (PrEP). You are considered at risk if:  You are a man who has sex with other men (MSM).  You are a heterosexual man or woman who is sexually active with more than one partner.  You take drugs by injection.  You are sexually active with a partner who has HIV.  Talk with your health care provider about whether you are at high risk of being infected with HIV. If you choose to begin PrEP, you should first be tested for HIV. You should then be tested every 3 months for as long as you are taking PrEP.  See your health care provider for regular screenings, exams, and tests for other STDs. Before having sex with a new partner, each of you should be screened for STDs and should talk about the results with each other. WHAT ARE THE BENEFITS OF SAFE SEX?   There is less chance of getting or giving an STD.  You can prevent unwanted or unintended pregnancies.  By discussing safe sex concerns with your partner, you may increase feelings of intimacy, comfort, trust, and honesty between the two of you.   This information is not intended to replace advice given to you by your health care provider. Make sure you discuss any questions you have with your health care provider.   Document Released: 03/06/2004 Document Revised: 02/17/2014 Document Reviewed: 07/21/2011  Elsevier Interactive Patient Education [employer]2016 Elsevier Inc.  Contraception Choices Contraception (birth control) is the use of any methods or devices to prevent pregnancy. Below are some methods to help avoid pregnancy. HORMONAL METHODS   Contraceptive implant. This is a thin, plastic tube containing progesterone hormone. It does not contain estrogen hormone. Your health care provider inserts the tube in the inner part of the upper arm. The tube can remain in place  for up to 3 years. After 3 years, the implant must be removed. The implant prevents the ovaries from releasing an egg (ovulation), thickens the cervical mucus to prevent sperm from entering the uterus, and thins the lining of the inside of the uterus.  Progesterone-only injections. These injections are given every 3 months by your health care provider to prevent pregnancy. This synthetic progesterone hormone stops the ovaries from releasing eggs. It also thickens cervical mucus and changes the uterine lining. This makes it harder for sperm to survive in the uterus.  Birth control pills. These pills contain estrogen and progesterone hormone. They work by preventing the ovaries from releasing eggs (ovulation). They also cause the cervical mucus to thicken, preventing the sperm from entering the uterus. Birth control pills are prescribed by a health care provider.Birth control pills can also be used to treat heavy periods.  Minipill. This type of birth control pill contains only the progesterone hormone. They are taken every day of each month and must be prescribed by your health care provider.  Birth control patch. The patch contains hormones similar to those in birth control pills. It must be changed once a week and is prescribed by a health care provider.  Vaginal ring. The ring contains hormones similar to those in birth control pills. It is left in the vagina for 3 weeks, removed for 1 week, and then a new one is put back in place. The patient must be comfortable inserting and removing the ring from the vagina.A health care provider's prescription is necessary.  Emergency contraception. Emergency contraceptives prevent pregnancy after unprotected sexual intercourse. This pill can be taken right after sex or up to 5 days after unprotected sex. It is most effective the sooner you take the pills after having sexual intercourse. Most emergency contraceptive pills are available without a prescription.  Check with your pharmacist. Do not use emergency contraception as your only form of birth control. BARRIER METHODS   Female condom. This is a thin sheath (latex or rubber) that is worn over the penis during sexual intercourse. It can be used with spermicide to increase effectiveness.  Female condom. This is a soft, loose-fitting sheath that is put into the vagina before sexual intercourse.  Diaphragm. This is a soft, latex, dome-shaped barrier that must be fitted by a health care provider. It is inserted into the vagina, along with a spermicidal jelly. It is inserted before intercourse. The diaphragm should be left in the vagina for 6 to 8 hours after intercourse.  Cervical cap. This is a round, soft, latex or plastic cup that fits over the cervix and must be fitted by a health care provider. The cap can be left in place for up to 48 hours after intercourse.  Sponge. This is a soft, circular piece of polyurethane foam. The sponge has spermicide in it. It is inserted into the vagina after wetting it and before sexual intercourse.  Spermicides. These are chemicals that kill or block sperm from entering the cervix and uterus. They come in the form of creams, jellies,  suppositories, foam, or tablets. They do not require a prescription. They are inserted into the vagina with an applicator before having sexual intercourse. The process must be repeated every time you have sexual intercourse. INTRAUTERINE CONTRACEPTION  Intrauterine device (IUD). This is a T-shaped device that is put in a woman's uterus during a menstrual period to prevent pregnancy. There are 2 types:  Copper IUD. This type of IUD is wrapped in copper wire and is placed inside the uterus. Copper makes the uterus and fallopian tubes produce a fluid that kills sperm. It can stay in place for 10 years.  Hormone IUD. This type of IUD contains the hormone progestin (synthetic progesterone). The hormone thickens the cervical mucus and prevents  sperm from entering the uterus, and it also thins the uterine lining to prevent implantation of a fertilized egg. The hormone can weaken or kill the sperm that get into the uterus. It can stay in place for 3-5 years, depending on which type of IUD is used. PERMANENT METHODS OF CONTRACEPTION  Female tubal ligation. This is when the woman's fallopian tubes are surgically sealed, tied, or blocked to prevent the egg from traveling to the uterus.  Hysteroscopic sterilization. This involves placing a small coil or insert into each fallopian tube. Your doctor uses a technique called hysteroscopy to do the procedure. The device causes scar tissue to form. This results in permanent blockage of the fallopian tubes, so the sperm cannot fertilize the egg. It takes about 3 months after the procedure for the tubes to become blocked. You must use another form of birth control for these 3 months.  Female sterilization. This is when the female has the tubes that carry sperm tied off (vasectomy).This blocks sperm from entering the vagina during sexual intercourse. After the procedure, the man can still ejaculate fluid (semen). NATURAL PLANNING METHODS  Natural family planning. This is not having sexual intercourse or using a barrier method (condom, diaphragm, cervical cap) on days the woman could become pregnant.  Calendar method. This is keeping track of the length of each menstrual cycle and identifying when you are fertile.  Ovulation method. This is avoiding sexual intercourse during ovulation.  Symptothermal method. This is avoiding sexual intercourse during ovulation, using a thermometer and ovulation symptoms.  Post-ovulation method. This is timing sexual intercourse after you have ovulated. Regardless of which type or method of contraception you choose, it is important that you use condoms to protect against the transmission of sexually transmitted infections (STIs). Talk with your health care provider about  which form of contraception is most appropriate for you.   This information is not intended to replace advice given to you by your health care provider. Make sure you discuss any questions you have with your health care provider.   Document Released: 01/27/2005 Document Revised: 02/01/2013 Document Reviewed: 07/22/2012 Elsevier Interactive Patient Education [employer]

## 2015-07-18 LAB — CYTOLOGY - PAP

## 2015-07-18 LAB — RPR

## 2015-07-18 LAB — CERVICOVAGINAL ANCILLARY ONLY
CHLAMYDIA, DNA PROBE: NEGATIVE
NEISSERIA GONORRHEA: NEGATIVE
Trichomonas: NEGATIVE

## 2015-07-18 LAB — HIV ANTIBODY (ROUTINE TESTING W REFLEX): HIV 1&2 Ab, 4th Generation: NONREACTIVE

## 2015-07-24 ENCOUNTER — Telehealth: Payer: Self-pay | Admitting: Family Medicine

## 2015-07-24 MED ORDER — METRONIDAZOLE 500 MG PO TABS: 500.0000 mg | ORAL_TABLET | Freq: Two times a day (BID) | ORAL | Status: DC

## 2015-07-24 NOTE — Telephone Encounter (Signed)
Patient called back & I spoke with her Reviewed neg STD results Wet prep with + BV - wants treatment. Will rx flagyl. Informed no alcohol while on this medication.  Also discussed pap (ASCUS with neg HPV) - next pap in 3 years Patient appreciative.  Latrelle DodrillBrittany J Shamyia Grandpre, MD

## 2015-07-24 NOTE — Telephone Encounter (Signed)
Attempted to reach patient to discuss No answer. Left voicemail asking her to return the call  When she calls back please contact me so I can discuss her results (no bad news, just want to go over it with her).  Thanks Latrelle DodrillBrittany J Marlina Cataldi, MD

## 2016-03-18 ENCOUNTER — Other Ambulatory Visit: Payer: Self-pay | Admitting: Family Medicine

## 2016-03-18 NOTE — Telephone Encounter (Signed)
Patient needs to schedule an office visit.  Katina Degreealeb M. Jimmey RalphParker, MD [facility] Resident PGY-3 03/18/2016 5:07 PM

## 2016-03-18 NOTE — Telephone Encounter (Signed)
Pt keeps getting BV. She doesn't understand why.  She would like to get a prescription for this. Since she is at work please return her call after 3:30.

## 2016-03-19 NOTE — Telephone Encounter (Signed)
Pt informed.  She will call back when she figures out her schedule . Anvay Tennis, Maryjo RochesterJessica Dawn, CMA

## 2016-03-19 NOTE — Telephone Encounter (Signed)
LM for patient to call back.  Please assist her in making an appointment to be evaluated for vaginal discharge.  She has not been seen since 07/2015 and will evaluation before any treatment can be sent to the pharmacy. Thanks Limited BrandsJazmin Zev Blue,CMA

## 2016-04-20 IMAGING — DX DG CHEST 2V
2 series · 2 of 2 positions shown · non-contrast
Comparison: June 06, 2014.

CLINICAL DATA: Acute shortness of breath.

EXAM:
CHEST  2 VIEW

[chest pa]
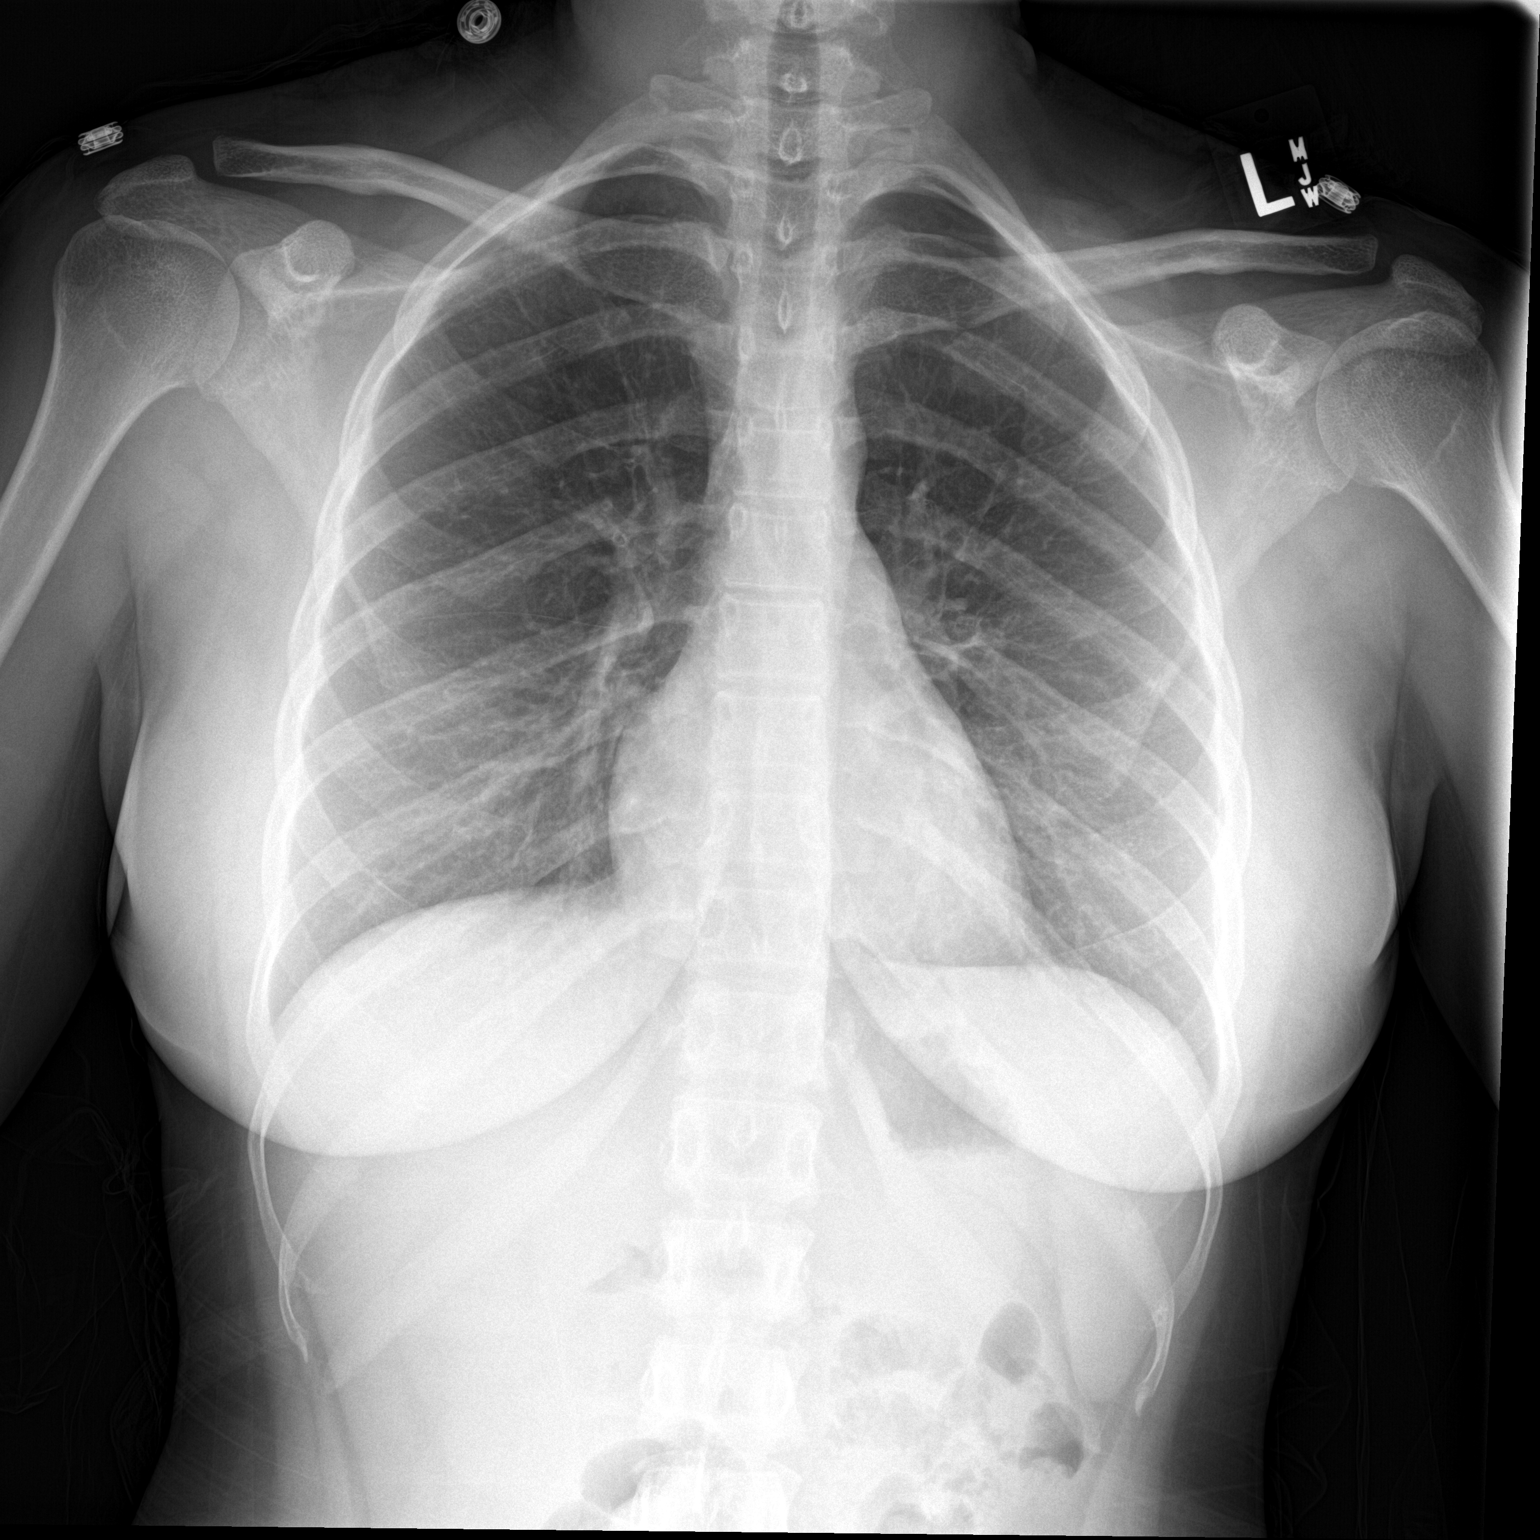

[chest lat]
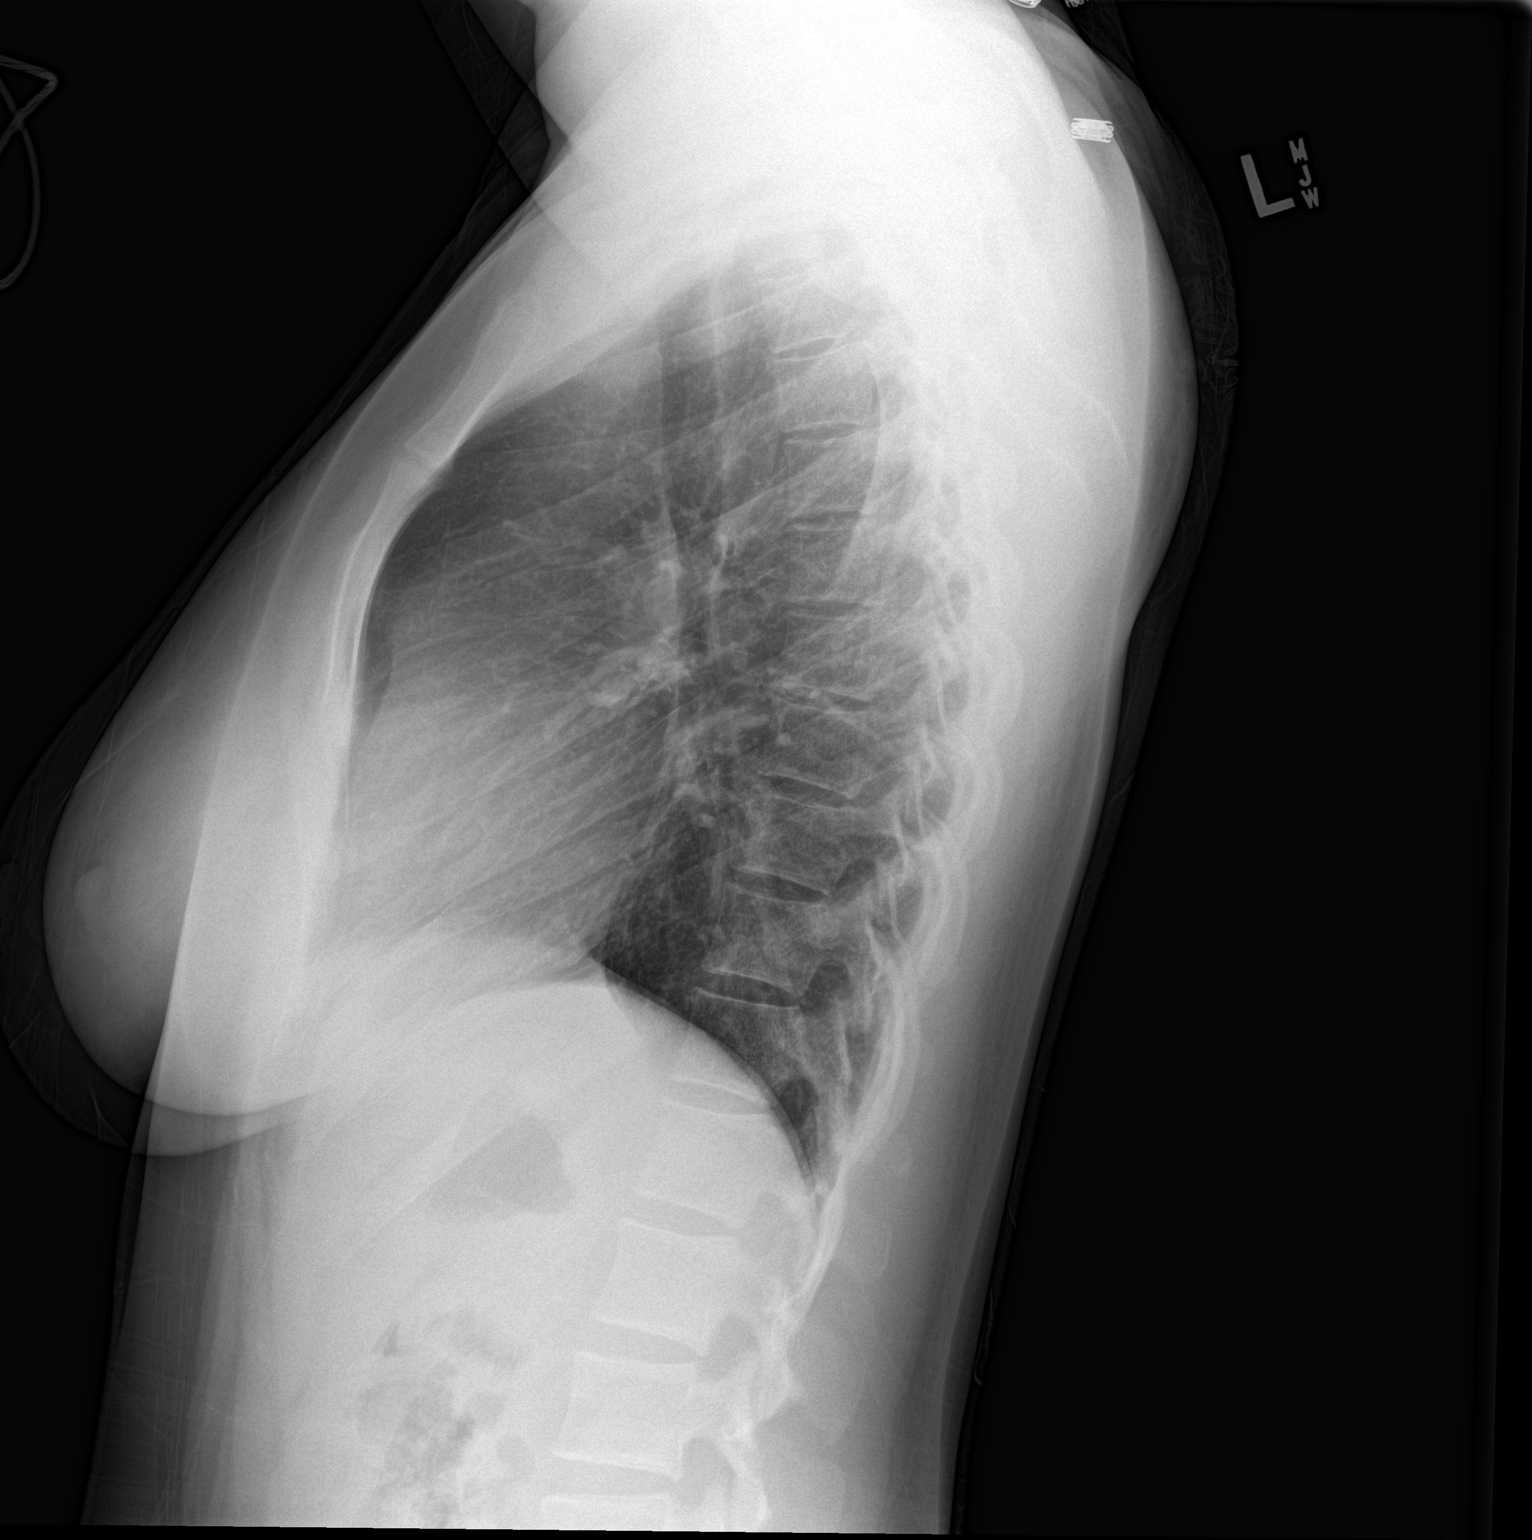

[2 of 2 positions shown; findings below may reference images not displayed]

FINDINGS: The heart size and mediastinal contours are within normal limits.
Both lungs are clear. No pneumothorax or pleural effusion is noted.
The visualized skeletal structures are unremarkable.
IMPRESSION: No active cardiopulmonary disease.

## 2016-06-30 ENCOUNTER — Emergency Department (HOSPITAL_COMMUNITY)
Admission: EM | Admit: 2016-06-30 | Discharge: 2016-06-30 | Disposition: A | Discharge status: Home / Self Care | Payer: No Typology Code available for payment source | Attending: Emergency Medicine | Admitting: Emergency Medicine

## 2016-06-30 ENCOUNTER — Encounter (HOSPITAL_COMMUNITY): Payer: Self-pay | Admitting: Emergency Medicine

## 2016-06-30 DIAGNOSIS — Y9389 Activity, other specified: Secondary | ICD-10-CM | POA: Insufficient documentation

## 2016-06-30 DIAGNOSIS — Y999 Unspecified external cause status: Secondary | ICD-10-CM | POA: Diagnosis not present

## 2016-06-30 DIAGNOSIS — Z79899 Other long term (current) drug therapy: Secondary | ICD-10-CM | POA: Insufficient documentation

## 2016-06-30 DIAGNOSIS — S8002XA Contusion of left knee, initial encounter: Secondary | ICD-10-CM | POA: Insufficient documentation

## 2016-06-30 DIAGNOSIS — Y929 Unspecified place or not applicable: Secondary | ICD-10-CM | POA: Insufficient documentation

## 2016-06-30 DIAGNOSIS — S8992XA Unspecified injury of left lower leg, initial encounter: Secondary | ICD-10-CM | POA: Diagnosis present

## 2016-06-30 DIAGNOSIS — F17210 Nicotine dependence, cigarettes, uncomplicated: Secondary | ICD-10-CM | POA: Insufficient documentation

## 2016-06-30 MED ORDER — IBUPROFEN 800 MG PO TABS: 800.0000 mg | ORAL_TABLET | Freq: Three times a day (TID) | ORAL | 0 refills | Status: DC
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
## 2016-06-30 NOTE — Discharge Instructions (Signed)
Return if any problems.

## 2016-07-03 ENCOUNTER — Emergency Department (HOSPITAL_COMMUNITY)
Admission: EM | Admit: 2016-07-03 | Discharge: 2016-07-04 | Disposition: A | Discharge status: Home / Self Care | Payer: No Typology Code available for payment source | Attending: Emergency Medicine | Admitting: Emergency Medicine

## 2016-07-03 ENCOUNTER — Emergency Department (HOSPITAL_COMMUNITY): Payer: No Typology Code available for payment source

## 2016-07-03 ENCOUNTER — Encounter (HOSPITAL_COMMUNITY): Payer: Self-pay | Admitting: Emergency Medicine

## 2016-07-03 DIAGNOSIS — S8992XA Unspecified injury of left lower leg, initial encounter: Secondary | ICD-10-CM | POA: Diagnosis present

## 2016-07-03 DIAGNOSIS — Z882 Allergy status to sulfonamides status: Secondary | ICD-10-CM | POA: Insufficient documentation

## 2016-07-03 DIAGNOSIS — Y999 Unspecified external cause status: Secondary | ICD-10-CM | POA: Insufficient documentation

## 2016-07-03 DIAGNOSIS — Z880 Allergy status to penicillin: Secondary | ICD-10-CM | POA: Diagnosis not present

## 2016-07-03 DIAGNOSIS — F17200 Nicotine dependence, unspecified, uncomplicated: Secondary | ICD-10-CM | POA: Insufficient documentation

## 2016-07-03 DIAGNOSIS — Y92410 Unspecified street and highway as the place of occurrence of the external cause: Secondary | ICD-10-CM | POA: Insufficient documentation

## 2016-07-03 DIAGNOSIS — R011 Cardiac murmur, unspecified: Secondary | ICD-10-CM | POA: Insufficient documentation

## 2016-07-03 DIAGNOSIS — Y9389 Activity, other specified: Secondary | ICD-10-CM | POA: Insufficient documentation

## 2016-07-03 DIAGNOSIS — S8992XS Unspecified injury of left lower leg, sequela: Secondary | ICD-10-CM
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
## 2016-07-04 NOTE — ED Notes (Signed)
Pt ambulatory and independent at discharge.  Verbalized understanding of discharge instructions 

## 2016-09-23 ENCOUNTER — Encounter: Payer: Self-pay | Admitting: Family Medicine

## 2016-09-23 ENCOUNTER — Emergency Department (HOSPITAL_COMMUNITY)
Admission: EM | Admit: 2016-09-23 | Discharge: 2016-09-23 | Disposition: A | Discharge status: Home / Self Care | Payer: Medicaid Other | Attending: Emergency Medicine | Admitting: Emergency Medicine

## 2016-09-23 ENCOUNTER — Ambulatory Visit (INDEPENDENT_AMBULATORY_CARE_PROVIDER_SITE_OTHER): Payer: Medicaid Other | Admitting: Family Medicine

## 2016-09-23 ENCOUNTER — Encounter (HOSPITAL_COMMUNITY): Payer: Self-pay | Admitting: Emergency Medicine

## 2016-09-23 VITALS — BP 132/70 | HR 92 | Temp 98.6°F | Wt 186.0 lb

## 2016-09-23 DIAGNOSIS — R040 Epistaxis: Secondary | ICD-10-CM | POA: Insufficient documentation

## 2016-09-23 DIAGNOSIS — B9789 Other viral agents as the cause of diseases classified elsewhere: Secondary | ICD-10-CM

## 2016-09-23 DIAGNOSIS — F17210 Nicotine dependence, cigarettes, uncomplicated: Secondary | ICD-10-CM | POA: Diagnosis not present

## 2016-09-23 DIAGNOSIS — B349 Viral infection, unspecified: Secondary | ICD-10-CM | POA: Diagnosis not present

## 2016-09-23 DIAGNOSIS — J069 Acute upper respiratory infection, unspecified: Secondary | ICD-10-CM | POA: Insufficient documentation

## 2016-09-23 DIAGNOSIS — Z79899 Other long term (current) drug therapy: Secondary | ICD-10-CM | POA: Diagnosis not present

## 2016-09-23 LAB — BASIC METABOLIC PANEL WITH GFR
Anion gap: 9 (ref 5–15)
BUN: 9 mg/dL (ref 6–20)
CO2: 22 mmol/L (ref 22–32)
Calcium: 9.4 mg/dL (ref 8.9–10.3)
Chloride: 106 mmol/L (ref 101–111)
Creatinine, Ser: 0.68 mg/dL (ref 0.44–1.00)
GFR calc Af Amer: >60 mL/min
GFR calc non Af Amer: >60 mL/min
Glucose, Bld: 94 mg/dL (ref 65–99)
Potassium: 3.6 mmol/L (ref 3.5–5.1)
Sodium: 137 mmol/L (ref 135–145)

## 2016-09-23 LAB — CBC
HCT: 37.7 % (ref 36.0–46.0)
Hemoglobin: 12.4 g/dL (ref 12.0–15.0)
MCH: 29.2 pg (ref 26.0–34.0)
MCHC: 32.9 g/dL (ref 30.0–36.0)
MCV: 88.7 fL (ref 78.0–100.0)
PLATELETS: 291 10*3/uL (ref 150–400)
RBC: 4.25 MIL/uL (ref 3.87–5.11)
RDW: 12.8 % (ref 11.5–15.5)
WBC: 5.5 10*3/uL (ref 4.0–10.5)

## 2016-09-23 MED ORDER — OXYMETAZOLINE HCL 0.05 % NA SOLN
1.0000 | Freq: Once | NASAL | Status: AC
  Administered 2016-09-23: 1 via NASAL
  Filled 2016-09-23: qty 15

## 2016-09-23 MED ORDER — ALBUTEROL SULFATE HFA 108 (90 BASE) MCG/ACT IN AERS: 1.0000 | INHALATION_SPRAY | Freq: Four times a day (QID) | RESPIRATORY_TRACT | 0 refills | Status: DC | PRN

## 2016-09-23 NOTE — Discharge Instructions (Signed)
Please read attached information. If you experience any new or worsening signs or symptoms please return to the emergency room for evaluation. Please follow-up with your primary care provider or specialist as discussed. Please use medication prescribed only as directed and discontinue taking if you have any concerning signs or symptoms.   °

## 2016-09-23 NOTE — Progress Notes (Signed)
Subjective:    Patient ID: Jill Dennis, female    DOB: 04-10-1995, 21 y.o.   MRN: [id number]   CC: Nosebleeds for the past 3 days  HPI: Patient is 21 yo female with a past medical history significant for anemia who presents today with nosebleeds for the past three days from left nare. Patient reports congestion, cough and mild rhinorrhea for the past three days. Patient started to have moderate nose bleeding that would last a few minutes and stop on its own. Patient has had multiple episode in the past three days. No prior history of nose bleed or bleeding. Patient has not tried any nasal packing and nasal spray. She denies any nose picking, or history of coagulopathy. She endorses headaches but denies any fever, chills, shortness of breath.  Smoking status reviewed   ROS: all other systems were reviewed and are negative other than in the HPI   Past Medical History:  Diagnosis Date  . Anemia 2013  . Heart murmur     Past Surgical History:  Procedure Laterality Date  . NO PAST SURGERIES      Past medical history, surgical, family, and social history reviewed and updated in the EMR as appropriate.  Objective:  BP 132/70   Pulse 92   Temp 98.6 F (37 C) (Oral)   Wt 186 lb (84.4 kg)   LMP 09/01/2016 (Approximate)   SpO2 98%   BMI 30.95 kg/m   Vitals and nursing note reviewed  General: NAD, pleasant, able to participate in exam HEENT: Some clots noted on left nare exam, no polyps or abrasion seen. No septal deviation or abnormal anatomy noted.  Cardiac: RRR, normal heart sounds, no murmurs. 2+ radial and PT pulses bilaterally Respiratory: CTAB, normal effort, No wheezes, rales or rhonchi Abdomen: soft, nontender, nondistended, no hepatic or splenomegaly, +BS Extremities: no edema or cyanosis. WWP. Skin: warm and dry, no rashes noted Neuro: alert and oriented x4, no focal deficits Psych: Normal affect and mood   Assessment & Plan:   #Epistaxis, acute,  ongoing Patient present with 3 days of epistaxis in the setting of recent URI. Patient has no history of nosebleed or coagulopathy concerning for current presentation. While in office patient had an unprovoked nose bleed that lasted about 2 min. Bleed appear to be be anterior, with no posterior bleeding reported. Plan was initially to use barrier such as vaseline or packing with follow up in clinic or ED if symptoms did not resolve/improve. Main concern is that bleeding has been going on for 3 days with no clear etiology.   Lovena Neighbours, MD [facility] PGY-2

## 2016-09-23 NOTE — ED Triage Notes (Signed)
Pt reports URI x 3 days with epistaxis, denies nausea, reports 1 episode of vomiting, denies blood in vomit.  Pt denies fever, chills.  Resp e/u.

## 2016-09-23 NOTE — ED Provider Notes (Signed)
[facility]-EMERGENCY DEPT Provider Note   CSN: [id number] Arrival date & time: 09/23/16  1516     History   Chief Complaint Chief Complaint  Patient presents with  . Epistaxis  . URI    HPI Jill Dennis is a 21 y.o. female.  HPI   21 year old female presents today with reports of viral URI and epistaxis. Patient was seen today by family medicine resident at [facility]. Patient has had 3 days of rhinorrhea, congestion, cough and chest tightness. She notes several episodes of nosebleed lasting several minutes that resolved without significant intervention. Patient was seen over at family medicine practice and sent over here as she had an unprovoked episode of epistaxis. Patient has no history of quite a lot of these, no known mechanical trauma. She reports the blood runs out of the anterior of the nose, no posterior drainage.  Past Medical History:  Diagnosis Date  . Anemia 2013  . Heart murmur     Patient Active Problem List   Diagnosis Date Noted  . Right shoulder pain 08/18/2014  . Anemia 01/27/2012  . Abnormal genetic test in pregnancy 11/04/2011  . Acne 05/09/2010    Past Surgical History:  Procedure Laterality Date  . NO PAST SURGERIES      OB History    Gravida Para Term Preterm AB Living   1 1 1  0 0 1   SAB TAB Ectopic Multiple Live Births   0 0 0 0 1       Home Medications    Prior to Admission medications   Medication Sig Start Date End Date Taking? Authorizing Provider  albuterol (PROVENTIL HFA;VENTOLIN HFA) 108 (90 Base) MCG/ACT inhaler Inhale 1-2 puffs into the lungs every 6 (six) hours as needed for wheezing or shortness of breath. 09/23/16   Theordore Cisnero, Tinnie Gens, PA-C  ibuprofen (ADVIL,MOTRIN) 800 MG tablet Take 1 tablet (800 mg total) by mouth 3 (three) times daily. 06/30/16   Elson Areas, PA-C  metroNIDAZOLE (FLAGYL) 500 MG tablet Take 1 tablet (500 mg total) by mouth 2 (two) times daily. For 7 days. Do not drink alcohol while taking  this medication. 07/24/15   Latrelle Dodrill, MD  ondansetron (ZOFRAN ODT) 4 MG disintegrating tablet Take 1 tablet (4 mg total) by mouth every 8 (eight) hours as needed for nausea or vomiting. 05/21/15   Ward, Layla Maw, DO    Family History Family History  Problem Relation Age of Onset  . Hypertension Mother   . Diabetes Maternal Aunt   . Hypertension Maternal Aunt   . Thyroid disease Maternal Aunt   . Hypertension Maternal Uncle   . Diabetes Maternal Grandmother   . Diabetes Maternal Grandfather     Social History Social History  Substance Use Topics  . Smoking status: Current Every Day Smoker    Packs/day: 0.25    Types: Cigarettes  . Smokeless tobacco: Never Used  . Alcohol use Yes     Comment: social     Allergies   Penicillins; Sulfa antibiotics; and Sulfonamide derivatives   Review of Systems Review of Systems  All other systems reviewed and are negative.    Physical Exam Updated Vital Signs BP 121/83 (BP Location: Left Arm)   Pulse 98   Temp 99.2 F (37.3 C) (Oral)   Resp 18   Ht 5\' 5"  (1.651 m)   Wt 84.4 kg (186 lb)   LMP 09/01/2016 (Approximate)   SpO2 98%   BMI 30.95 kg/m  Physical Exam  Constitutional: She is oriented to person, place, and time. She appears well-developed and well-nourished.  HENT:  Head: Normocephalic and atraumatic.  Minor superficial abrasion to the right anterior nasal septum no active bleeding- remainder of exam normal with the exception of rhinorrhea  Eyes: Pupils are equal, round, and reactive to light. Conjunctivae are normal. Right eye exhibits no discharge. Left eye exhibits no discharge. No scleral icterus.  Neck: Normal range of motion. No JVD present. No tracheal deviation present.  Pulmonary/Chest: Effort normal and breath sounds normal. No stridor. No respiratory distress. She has no wheezes. She has no rales. She exhibits no tenderness.  Neurological: She is alert and oriented to person, place, and time.  Coordination normal.  Psychiatric: She has a normal mood and affect. Her behavior is normal. Judgment and thought content normal.  Nursing note and vitals reviewed.    ED Treatments / Results  Labs (all labs ordered are listed, but only abnormal results are displayed) Labs Reviewed  BASIC METABOLIC PANEL  CBC    EKG  EKG Interpretation None       Radiology No results found.  Procedures Procedures (including critical care time)  Medications Ordered in ED Medications  oxymetazoline (AFRIN) 0.05 % nasal spray 1 spray (not administered)     Initial Impression / Assessment and Plan / ED Course  I have reviewed the triage vital signs and the nursing notes.  Pertinent labs & imaging results that were available during my care of the patient were reviewed by me and considered in my medical decision making (see chart for details).     21 year old female presents with epistaxis. She has no active bleeding here. She has a very small superficial abrasion to the anterior nasal septum. Patient has no quite neuropathy, she has reassuring vital signs, reassuring laboratory analysis. I educated the patient that short episodes of nosebleed with likely resolve with direct pressure as patient reports she has been leaning over the sink allow me to bleed freely. Patient is instructed use Afrin and cotton nasal packing with direct pressure if symptoms do not improve with normal direct pressure. They do not see any indication for nasal packing, cauterization, or any other intervention at this time. Patient's upper respiratory infection is likely viral in nature, she has no signs of acute bacterial infection. No need for antibiotics at this time. Patient will be discharged with strict return precautions and follow-up information. She verbalized understanding and agreement to today's plan had no further questions or concerns at time of discharge.  Final Clinical Impressions(s) / ED Diagnoses   Final  diagnoses:  Epistaxis  Viral URI with cough    New Prescriptions New Prescriptions   ALBUTEROL (PROVENTIL HFA;VENTOLIN HFA) 108 (90 BASE) MCG/ACT INHALER    Inhale 1-2 puffs into the lungs every 6 (six) hours as needed for wheezing or shortness of breath.     Eyvonne MechanicHedges, Michell Kader, PA-C 09/23/16 1825    Abelino DerrickMackuen, Courteney Lyn, MD 09/24/16 1050

## 2016-09-23 NOTE — ED Notes (Signed)
ED Provider at bedside. 

## 2016-09-24 ENCOUNTER — Telehealth: Payer: Self-pay | Admitting: *Deleted

## 2016-09-24 NOTE — Telephone Encounter (Signed)
Patient is aware that letter is ready for pick up at the front desk and also a copy was sent to her mychart. Jill Dennis,CMA

## 2016-09-24 NOTE — Telephone Encounter (Signed)
Patient called stating she was seen in clinic yesterday and sent to ED.  Patient was not able to go to work today, she is still having symptoms. She is requesting a work note for yesterday and today. Please give her a call at [phone].  Clovis PuMartin, Sascha Palma L, RN

## 2016-09-24 NOTE — Telephone Encounter (Signed)
Thank you [name] for taking care of that.  Jill NeighboursAbdoulaye Viggo Perko, MD [facility], PGY-2

## 2016-09-26 ENCOUNTER — Ambulatory Visit (INDEPENDENT_AMBULATORY_CARE_PROVIDER_SITE_OTHER): Payer: Medicaid Other | Admitting: Family Medicine

## 2016-09-26 ENCOUNTER — Telehealth: Payer: Self-pay

## 2016-09-26 ENCOUNTER — Encounter: Payer: Self-pay | Admitting: Family Medicine

## 2016-09-26 VITALS — BP 120/70 | HR 79 | Temp 98.1°F | Ht 65.0 in | Wt 187.8 lb

## 2016-09-26 DIAGNOSIS — J069 Acute upper respiratory infection, unspecified: Secondary | ICD-10-CM | POA: Insufficient documentation

## 2016-09-26 MED ORDER — IBUPROFEN 800 MG PO TABS
800.0000 mg | ORAL_TABLET | Freq: Three times a day (TID) | ORAL | 0 refills | Status: DC
Start: 2016-09-26 — End: 2017-05-18

## 2016-09-26 MED ORDER — BENZONATATE 100 MG PO CAPS: 100.0000 mg | ORAL_CAPSULE | Freq: Three times a day (TID) | ORAL | 0 refills | Status: DC | PRN

## 2016-09-26 MED ORDER — GUAIFENESIN ER 600 MG PO TB12: 600.0000 mg | ORAL_TABLET | Freq: Two times a day (BID) | ORAL | 0 refills | Status: DC

## 2016-09-26 NOTE — Assessment & Plan Note (Signed)
Patient has been having 6 days of cough, congestion that has now developed into a cough that causes chest pain. Likely upper respiratory viral process that is now bronchitis. PNA very unlikely given normal vitals and physical exam. Bacterial unlikely given physical exam.  Prescribed tessalon pearls for cough. Mucinex to loosen mucus that comes up with the cough. Prescribed Ibuprofen 800mg  for generalized aches and pain that occurs with cough.

## 2016-09-26 NOTE — Telephone Encounter (Signed)
Called patient to tell her that she left her prescription here in the office for Ibuprofen.  I will leave it up front.  There was no voice mail so a message was not left.Jill Dennis

## 2016-09-26 NOTE — Progress Notes (Signed)
Subjective: No chief complaint on file.    HPI: Jill Dennis is a 21 y.o. presenting to clinic today to discuss the following:  1 Acute cough, congestion, generalized feeling bad.  [name] is being seen after visiting our clinic several days ago and then the ED due to new nose bleeding. The nose bleeding has now resolved but she states her cold symptoms have worsened. She states she is coughing more and has chest pain when she coughs. The cough is mostly non-productive with some yellowish, slimey sputum. The pain radiates to her shoulders bilaterally but stays in her chest, is rated a 7/10, and her chest "feels heavy". It gets worse when laying down. She has tried an inhaler given to her at the ED and Dayquil OTC but they did not help. A hot shower improves her symptoms.  She has associated congestion, SOB. She denies fever, nausea, vomiting, or chills.      Health Maintenance: none     ROS noted in HPI.   Past Medical, Surgical, Social, and Family History Reviewed & Updated per EMR.   Pertinent Historical Findings include:   History  Smoking Status  . Current Every Day Smoker  . Packs/day: 0.25  . Types: Cigarettes  Smokeless Tobacco  . Never Used      Objective: BP 120/70 (BP Location: Left Arm, Patient Position: Sitting, Cuff Size: Normal)   Pulse 79   Temp 98.1 F (36.7 C) (Oral)   Wt 187 lb 12.8 oz (85.2 kg)   LMP 09/01/2016 (Approximate)   SpO2 98%   BMI 31.25 kg/m  Vitals and nursing notes reviewed  Physical Exam   Gen: Alert and Oriented x 3, NAD HEENT: Normocephalic, atraumatic, PERRLA, EOMI, TM visible with good light reflex, minimal clear fluid behind tympanic membrane, mildly swollen, erythematous, boggy turbinates, non-tender sinuses bilaterally, normal pharyngeal mucosa Neck: trachea midline, no thyroidmegaly, no LAD Resp: CTAB, no wheezing, rales, or rhonchi, comfortable work of breathing CV: RRR, no murmurs, normal S1, S2 split, +2  pulses dorsalis pedis bilaterally Abd: non-distended, non-tender, soft, +bs in all four quadrants, no hepatosplenomegaly MSK: FROM in all four extremities Ext: no clubbing, cyanosis, or edema Skin: warm, dry, intact, no rashes   Results for orders placed or performed during the hospital encounter of 09/23/16 (from the past 72 hour(s))  Basic metabolic panel     Status: None   Collection Time: 09/23/16  3:31 PM  Result Value Ref Range   Sodium 137 135 - 145 mmol/L   Potassium 3.6 3.5 - 5.1 mmol/L   Chloride 106 101 - 111 mmol/L   CO2 22 22 - 32 mmol/L   Glucose, Bld 94 65 - 99 mg/dL   BUN 9 6 - 20 mg/dL   Creatinine, Ser 0.68 0.44 - 1.00 mg/dL   Calcium 9.4 8.9 - 10.3 mg/dL   GFR calc non Af Amer >60 >60 mL/min   GFR calc Af Amer >60 >60 mL/min    Comment: (NOTE) The eGFR has been calculated using the CKD EPI equation. This calculation has not been validated in all clinical situations. eGFR's persistently <60 mL/min signify possible Chronic Kidney Disease.    Anion gap 9 5 - 15  CBC     Status: None   Collection Time: 09/23/16  3:31 PM  Result Value Ref Range   WBC 5.5 4.0 - 10.5 K/uL   RBC 4.25 3.87 - 5.11 MIL/uL   Hemoglobin 12.4 12.0 - 15.0 g/dL  HCT 37.7 36.0 - 46.0 %   MCV 88.7 78.0 - 100.0 fL   MCH 29.2 26.0 - 34.0 pg   MCHC 32.9 30.0 - 36.0 g/dL   RDW 12.8 11.5 - 15.5 %   Platelets 291 150 - 400 K/uL    Assessment/Plan:  Acute upper respiratory infection Patient has been having 6 days of cough, congestion that has now developed into a cough that causes chest pain. Likely upper respiratory viral process that is now bronchitis. PNA very unlikely given normal vitals and physical exam. Bacterial unlikely given physical exam.  Prescribed tessalon pearls for cough. Mucinex to loosen mucus that comes up with the cough. Prescribed Ibuprofen 853m for generalized aches and pain that occurs with cough.     Please see problem based Assessment and Plan PATIENT EDUCATION  PROVIDED: See AVS    Diagnosis and plan along with any newly prescribed medication(s) were discussed in detail with this patient today. The patient verbalized understanding and agreed with the plan. Patient advised if symptoms worsen return to clinic or ER.   Health Maintainance:   No orders of the defined types were placed in this encounter.   Meds ordered this encounter  Medications  . benzonatate (TESSALON PERLES) 100 MG capsule    Sig: Take 1 capsule (100 mg total) by mouth 3 (three) times daily as needed for cough.    Dispense:  20 capsule    Refill:  0  . guaiFENesin (MUCINEX) 600 MG 12 hr tablet    Sig: Take 1 tablet (600 mg total) by mouth 2 (two) times daily.    Dispense:  10 tablet    Refill:  0  . ibuprofen (ADVIL,MOTRIN) 800 MG tablet    Sig: Take 1 tablet (800 mg total) by mouth 3 (three) times daily.    Dispense:  21 tablet    Refill:  0     THarolyn Rutherford DO 09/26/2016, 2:47 PM PGY-1, [facility]

## 2016-09-26 NOTE — Patient Instructions (Addendum)
It was great to meet you today! Thank you for letting me participate in your care!  Today, we discussed your likely upper respiratory infection that is becoming bronchitis. You most likely have a viral infection causing the majority of your symptoms. I have prescribed an anti-cough medication and a medication to help break up the mucous in your lungs. Please take them as prescribed.  Please return to the clinic if you symptoms worsen or if you continue to have new or concerning symptoms like difficulty breathing at rest, loss of consciousness, extreme fatigue, etc. If your symptoms to do improve by Wednesday please call the office.  Be well, Jules Schick, DO PGY-1, [facility]

## 2016-10-30 ENCOUNTER — Encounter: Payer: Self-pay | Admitting: Internal Medicine

## 2016-10-30 ENCOUNTER — Ambulatory Visit (INDEPENDENT_AMBULATORY_CARE_PROVIDER_SITE_OTHER): Payer: Medicaid Other | Admitting: Internal Medicine

## 2016-10-30 DIAGNOSIS — G4489 Other headache syndrome: Secondary | ICD-10-CM

## 2016-10-30 DIAGNOSIS — R51 Headache: Secondary | ICD-10-CM | POA: Diagnosis not present

## 2016-10-30 DIAGNOSIS — R519 Headache, unspecified: Secondary | ICD-10-CM | POA: Insufficient documentation

## 2016-10-30 MED ORDER — BUTALBITAL-APAP-CAFF-COD 50-325-40-30 MG PO CAPS: 1.0000 | ORAL_CAPSULE | ORAL | 0 refills | Status: DC | PRN

## 2016-10-30 NOTE — Patient Instructions (Addendum)
It was nice meeting you today Jill Dennis!  For your headaches, you can continue to take ibuprofen every 4-6 hours as needed. If your headache becomes very severe, you can take one Fioricet tablet. DO NOT take Tylenol with Fioricet. Fioricet will likely make you sleepy, so please be aware of this.   I have ordered an MRI for you. You will be contacted with the date and time of this. It is important that you have the MRI performed so we can make sure there is not another cause for your headaches.   If you develop a sudden very severe headache, if you experience numbness or weakness with headaches, or if you have a headache that does not improve even with Fioricet, please call our clinic or go to the emergency room.   If you have any questions or concerns, please feel free to call the clinic.   Be well,  Dr. Natale Milch

## 2016-10-30 NOTE — Assessment & Plan Note (Signed)
Abruptly began one week ago and occurring at least every other day. No history of migraines or headaches in general. Does seem consistent with migraines, given photo- and phonophobia, nausea, improvement with laying down, lack of improvement with NSAIDs alone, and length of HA, however unusual that they would begin so suddenly and occur so frequently. No known trauma preceding onset as explanation. Encouraging that there are no neuro deficits either reported or on exam today, however still concern for possible underlying cause. As such, will proceed with imaging. As not entirely convinced this is migraine, will not begin triptan or other type of abortive therapy today, but will instead prescribe Fioricet.  - Prescription for Fioricet #10 with no refills. Discussed not taking with Tylenol.  - Can continue to take ibuprofen as needed.  - MRI w/wo contrast ordered - Strict return precautions given

## 2016-10-30 NOTE — Progress Notes (Signed)
   Subjective:   Patient: Jill Dennis       Birthdate: 31-Jul-1995       MRN: [id number]      HPI  Jill Dennis is a 21 y.o. female presenting for same day appt for headaches.   Headaches Began one week ago. Occurring every other day. Only on L. Describes pain as "thumping." Has had nausea but no vomiting. Eyes have been watering as well. HA is present when patient wakes up in the morning and lasts for many hours. Was so severe yesterday that she had to leave work. Pain has brought patient to tears. Has been taking  ibuprofen as well as Tylenol with no relief. HA improves with laying down. Endorses significant phonophobia, some photophobia. Denies vision changes. Denies neuro deficits, numbness, weakness. No trauma prior to onset of headaches last week. No history of migraines or headaches.   Smoking status reviewed. Patient is current every day smoker.   Review of Systems See HPI.     Objective:  Physical Exam  Constitutional: She is oriented to person, place, and time and well-developed, well-nourished, and in no distress.  HENT:  Head: Normocephalic and atraumatic.  Nose: Nose normal.  Mouth/Throat: Oropharynx is clear and moist. No oropharyngeal exudate.  Eyes: Pupils are equal, round, and reactive to light. Conjunctivae and EOM are normal. Right eye exhibits no discharge. Left eye exhibits no discharge.  Endorses increased pain during pupillary exam with light exposure  Pulmonary/Chest: Effort normal. No respiratory distress.  Musculoskeletal:  5/5 strength upper and lower extremities bilaterally   Neurological: She is alert and oriented to person, place, and time.  CN II-XII intact.   Skin: Skin is warm and dry.  Psychiatric: Affect and judgment normal.      Assessment & Plan:  Headache Abruptly began one week ago and occurring at least every other day. No history of migraines or headaches in general. Does seem consistent with migraines, given photo- and  phonophobia, nausea, improvement with laying down, lack of improvement with NSAIDs alone, and length of HA, however unusual that they would begin so suddenly and occur so frequently. No known trauma preceding onset as explanation. Encouraging that there are no neuro deficits either reported or on exam today, however still concern for possible underlying cause. As such, will proceed with imaging. As not entirely convinced this is migraine, will not begin triptan or other type of abortive therapy today, but will instead prescribe Fioricet.  - Prescription for Fioricet #10 with no refills. Discussed not taking with Tylenol.  - Can continue to take ibuprofen as needed.  - MRI w/wo contrast ordered - Strict return precautions given - Precepted with Dr. Pollie Meyer.   Tarri Abernethy, MD, MPH PGY-3 [facility] Pager [phone]

## 2016-11-03 ENCOUNTER — Telehealth: Payer: Self-pay

## 2016-11-03 ENCOUNTER — Telehealth: Payer: Self-pay | Admitting: *Deleted

## 2016-11-03 NOTE — Telephone Encounter (Signed)
I have tried several times to get in touch with the patient to inform her of her MRI Brain. Has not been able to contact her. There is no dial tone and of course no voicemail.   I also tried calling her emergency contact who is her mother and there was no dial tone at her number either. I will cancel her appointment for the MRI since she can not be reached.Glennie Hawk

## 2016-11-03 NOTE — Telephone Encounter (Signed)
Dr. Natale Milch, since you saw this patient do feel like this is a reasonable request?  I'm happy to write it just wanted to check with you.   Jill Trostle L. Myrtie Soman, MD [facility] Resident PGY-2 11/03/2016 5:42 PM

## 2016-11-03 NOTE — Telephone Encounter (Signed)
Patient call nurse line and states that her "migraines are no better and she cant go to work while taking the medications".   She needs a letter for her job.  Attempted to call back and see if there are specific days, but no answer and no machine. Saryna Kneeland, Maryjo Rochester, CMA

## 2016-11-04 NOTE — Telephone Encounter (Signed)
If her headaches are still that severe despite taking Fioricet I think she should go to the ED. I called her just now to discuss that with her and she didn't answer and has no voicemail set up. I'll try to call her a couple more times this morning. Dr. Pollie Meyer and I were concerned given the sudden onset and other characteristics of her headache, and I discussed with the patient that if they worsen she needs to go to the ED, so I'd feel more comfortable with her doing that rather than just staying out of work and waiting until Sept 28 for her MRI. If she calls again, she should be instructed to go to the ED. I'll let you know when/if I'm able to get in touch with her.   Tarri Abernethy, MD, MPH PGY-3 [facility] Pager [phone]

## 2016-11-04 NOTE — Telephone Encounter (Signed)
Attempted to call patient again. No answer and no voicemail. Will attempt again later this AM.   Tarri Abernethy, MD, MPH PGY-3 [facility] Pager [phone]

## 2016-11-04 NOTE — Telephone Encounter (Signed)
I can try to get in touch with her as well.  Thanks for the update.   Jordane Hisle L. Myrtie Soman, MD [facility] Resident PGY-2 11/04/2016 9:37 AM

## 2016-11-04 NOTE — Telephone Encounter (Signed)
Called patient again. No answer, no voicemail. Will ask CMA to call again this afternoon.   Tarri Abernethy, MD, MPH PGY-3 [facility] Pager [phone]

## 2016-11-04 NOTE — Telephone Encounter (Signed)
Called pt. Phone rang with no answer, no voicemail. Sunday Spillers, CMA

## 2016-11-04 NOTE — Telephone Encounter (Signed)
Called patient one last time and got no answer and there is still no voice mailbox to leave a message.  I cancelled the MRI until we can hear back from the patient.Jill Dennis

## 2016-11-05 ENCOUNTER — Emergency Department (HOSPITAL_COMMUNITY)
Admission: EM | Admit: 2016-11-05 | Discharge: 2016-11-05 | Disposition: A | Discharge status: Home / Self Care | Payer: Medicaid Other | Attending: Emergency Medicine | Admitting: Emergency Medicine

## 2016-11-05 ENCOUNTER — Encounter (HOSPITAL_COMMUNITY): Payer: Self-pay | Admitting: Emergency Medicine

## 2016-11-05 DIAGNOSIS — R519 Headache, unspecified: Secondary | ICD-10-CM

## 2016-11-05 DIAGNOSIS — R51 Headache: Secondary | ICD-10-CM | POA: Diagnosis not present

## 2016-11-05 DIAGNOSIS — F17210 Nicotine dependence, cigarettes, uncomplicated: Secondary | ICD-10-CM | POA: Insufficient documentation

## 2016-11-05 MED ORDER — KETOROLAC TROMETHAMINE 30 MG/ML IJ SOLN
30.0000 mg | Freq: Once | INTRAMUSCULAR | Status: AC
  Administered 2016-11-05: 30 mg via INTRAMUSCULAR
  Filled 2016-11-05: qty 1

## 2016-11-05 NOTE — Telephone Encounter (Signed)
If patient's headaches are still this severe she needs to go to ED, despite the wait time. They can give her a work note in the ED. I do not think it is appropriate to give her a work note at this time, as I am concerned that there may be a more serious cause for her headaches which needs to be evaluated sooner rather than later. She does need to have the MRI, but if she goes to the ED they can do whatever head imaging they feel appropriate at that time. Please try to call patient at correct number to let her know she needs to go to ED and we will not be providing work note at this time.   Tarri Abernethy, MD, MPH PGY-3 [facility] Pager [phone]

## 2016-11-05 NOTE — ED Provider Notes (Signed)
[facility]EM[facility]ENCY DEPT Provider Note   CSN: [id number] Arrival date & time: 11/05/16  1348     History   Chief Complaint Chief Complaint  Patient presents with  . Migraine    HPI Jill Dennis is a 21 y.o. female w PMHx anemia, presenting to ED requesting MRI for headache that has been intermittent for 2 weeks. Pt was seen by PCP, who prescribed her "codeine" and scheduled an outpatient MRI for Friday, 11/07/16. She reports to the ED stating her PCP instructed her to report to ED if she could not wait for MRI until Friday. She states headache is throbbing in nature with associated photophobia and nausea. She denies vision changes, weakness, numbness or tingling, slurred speech or facial droop, head trauma. She reports alternating Tylenol and Advil for pain, with temporary relief. She states she did not like the codeine she was prescribed by her PCP.  Prior to initial evaluation, patient stating she was going to leave due to wait time. The history is provided by the patient.    Past Medical History:  Diagnosis Date  . Anemia 2013  . Heart murmur     Patient Active Problem List   Diagnosis Date Noted  . Headache 10/30/2016  . Acute upper respiratory infection 09/26/2016  . Right shoulder pain 08/18/2014  . Anemia 01/27/2012  . Abnormal genetic test in pregnancy 11/04/2011  . Acne 05/09/2010    Past Surgical History:  Procedure Laterality Date  . NO PAST SURGERIES      OB History    Gravida Para Term Preterm AB Living   0 0 1   SAB TAB Ectopic Multiple Live Births   0 0 0 0 1       Home Medications    Prior to Admission medications   Medication Sig Start Date End Date Taking? Authorizing Provider  albuterol (PROVENTIL HFA;VENTOLIN HFA) 108 (90 Base) MCG/ACT inhaler Inhale 1-2 puffs into the lungs every 6 (six) hours as needed for wheezing or shortness of breath. 09/23/16   Hedges, Tinnie Gens, PA-C  benzonatate (TESSALON PERLES) 100 MG capsule Take 1 capsule (100  mg total) by mouth 3 (three) times daily as needed for cough. 09/26/16   Arlyce Harman, DO  butalbital-acetaminophen-caffeine (FIORICET/CODEINE) 40-981-19-14 MG capsule Take 1 capsule by mouth every 4 (four) hours as needed for headache. 10/30/16   Marquette Saa, MD  guaiFENesin (MUCINEX) 600 MG 12 hr tablet Take 1 tablet (600 mg total) by mouth 2 (two) times daily. 09/26/16   Arlyce Harman, DO  ibuprofen (ADVIL,MOTRIN) 800 MG tablet Take 1 tablet (800 mg total) by mouth 3 (three) times daily. 09/26/16   Arlyce Harman, DO  metroNIDAZOLE (FLAGYL) 500 MG tablet Take 1 tablet (500 mg total) by mouth 2 (two) times daily. For 7 days. Do not drink alcohol while taking this medication. 07/24/15   Latrelle Dodrill, MD  ondansetron (ZOFRAN ODT) 4 MG disintegrating tablet Take 1 tablet (4 mg total) by mouth every 8 (eight) hours as needed for nausea or vomiting. 05/21/15   Ward, Layla Maw, DO    Family History Family History  Problem Relation Age of Onset  . Hypertension Mother   . Diabetes Maternal Aunt   . Hypertension Maternal Aunt   . Thyroid disease Maternal Aunt   . Hypertension Maternal Uncle   . Diabetes Maternal Grandmother   . Diabetes Maternal Grandfather     Social History Social History  Substance Use Topics  . Smoking status: Current  Every Day Smoker    Packs/day: 0.25    Types: Cigarettes  . Smokeless tobacco: Never Used  . Alcohol use Yes     Comment: social     Allergies   Penicillins; Sulfa antibiotics; and Sulfonamide derivatives   Review of Systems Review of Systems  Constitutional: Negative for fever.  Eyes: Positive for photophobia. Negative for visual disturbance.  Gastrointestinal: Positive for nausea. Negative for vomiting.  Musculoskeletal: Negative for neck pain and neck stiffness.  Neurological: Positive for headaches. Negative for syncope, weakness and numbness.  All other systems reviewed and are negative.    Physical Exam Updated  Vital Signs BP 125/78 (BP Location: Left Arm)   Pulse 79   Temp 98.9 F (37.2 C) (Oral)   Resp 16   LMP 11/03/2016   SpO2 97%   Physical Exam  Constitutional: She is oriented to person, place, and time. She appears well-developed and well-nourished. No distress.  Well-appearing, not in distress.  HENT:  Head: Normocephalic and atraumatic.  Mouth/Throat: Oropharynx is clear and moist.  Eyes: Pupils are equal, round, and reactive to light. Conjunctivae and EOM are normal.  Neck: Normal range of motion. Neck supple.  Cardiovascular: Normal rate, regular rhythm, normal heart sounds and intact distal pulses.   Pulmonary/Chest: Effort normal and breath sounds normal. No respiratory distress.  Abdominal: Soft. Bowel sounds are normal. There is no tenderness.  Musculoskeletal: Normal range of motion.  Neurological: She is alert and oriented to person, place, and time.  Mental Status:  Alert, oriented, thought content appropriate, able to give a coherent history. Speech fluent without evidence of aphasia. Able to follow 2 step commands without difficulty.  Cranial Nerves:  II:  Peripheral visual fields grossly normal, pupils equal, round, reactive to light III,IV, VI: ptosis not present, extra-ocular motions intact bilaterally  V,VII: smile symmetric, facial light touch sensation equal VIII: hearing grossly normal to voice  X: uvula elevates symmetrically  XI: bilateral shoulder shrug symmetric and strong XII: midline tongue extension without fassiculations Motor:  Normal tone. 5/5 in upper and lower extremities bilaterally including strong and equal grip strength and dorsiflexion/plantar flexion Sensory: Pinprick and light touch normal in all extremities.  Deep Tendon Reflexes: 2+ and symmetric in the biceps and patella Cerebellar: normal finger-to-nose with bilateral upper extremities Gait: normal gait and balance CV: distal pulses palpable throughout    Skin: Skin is warm.    Psychiatric: She has a normal mood and affect. Her behavior is normal.  Nursing note and vitals reviewed.    ED Treatments / Results  Labs (all labs ordered are listed, but only abnormal results are displayed) Labs Reviewed - No data to display  EKG  EKG Interpretation None       Radiology No results found.  Procedures Procedures (including critical care time)  Medications Ordered in ED Medications  ketorolac (TORADOL) 30 MG/ML injection 30 mg (30 mg Intramuscular Given 11/05/16 1802)     Initial Impression / Assessment and Plan / ED Course  I have reviewed the triage vital signs and the nursing notes.  Pertinent labs & imaging results that were available during my care of the patient were reviewed by me and considered in my medical decision making (see chart for details).     Pt presenting with intermittent headache 2 weeks. Patient with outpatient MRI scheduled for Friday. On exam, no focal neuro deficits, well-appearing, not in distress. HA treated with IM Toradol, as patient not requesting locations that could make her drowsy.Marland Kitchen  Presentation  not concerning for [facility], ICH, Meningitis, or temporal arteritis. Pt is afebrile with no focal neuro deficits, nuchal rigidity, or change in vision. Pt is to follow up with PCP to discuss prophylactic medication. Pt verbalizes understanding and is agreeable with plan to dc.   Discussed results, findings, treatment and follow up. Patient advised of return precautions. Patient verbalized understanding and agreed with plan.  Final Clinical Impressions(s) / ED Diagnoses   Final diagnoses:  Bad headache    New Prescriptions Discharge Medication List as of 11/05/2016  6:00 PM       Russo, [place] N, PA-C 11/05/16 Silva Bandy    Alvira Monday, MD 11/06/16 1316

## 2016-11-05 NOTE — Telephone Encounter (Signed)
Patient called again requesting a work note. She stated she unable to take the medication prescribed because she can't take it while working.  She is requesting a letter today. She has to work at 12 PM-9 PM; however she stated when the migraine starts she has to leave. She can't stay a whole shift. She has tried going to ED; however the wait time does not work if she has to work.  She gave her job the note from previous visit but they need something else.    Read previous messages to patient from Dr. Natale Milch and Dr. Myrtie Soman. Patient stated she did not receive any of the calls or messages. The phone number on file was not correct.  Patient stated she told the front desk that her number was changed. Number was corrected in patient's chart.  Patient also stated she never received a call regarding the appointment for MRI. Advised patient that MRI was scheduled for this Friday 11/07/16 at 2 PM, but was cancelled for some reason. Please call with new appointment.    Jill Pu, RN

## 2016-11-05 NOTE — Discharge Instructions (Signed)
Attend your appointment on Friday. Drink plenty of water. You can also try caffeine, as this can help your headache. Take  of advil/ibuprofen every 6 hours for headache. You can alternate with tylenol. Return to the ER for new or concerning symptoms.

## 2016-11-05 NOTE — Telephone Encounter (Signed)
Spoke to pt. Informed her of the message below.  She is going to go to the ED. Sunday Spillers, CMA

## 2016-11-05 NOTE — ED Triage Notes (Signed)
Pt to ED c/o headache x 2 weeks, states it goes away but then comes right back the next day. Pt has seen her PCP for it and reports having MRI scheduled for this Friday but was told to come here because she didn't want to wait. Pt denies any accompanying symptoms with headache, no neck stiffness, photophobia, fevers. Pt has taken ibuprofen and Tylenol without relief, was prescribed a medication but hasn't taken it because it would make her drowsy.

## 2016-11-05 NOTE — ED Notes (Addendum)
H/a x 2 weeks. She states has been taking tylenol and motrin pain comes and goes but not all the way has MRI orgered for  Friday but statews was told she needed to come to er and get it now, pt arrives with food from cafe states saw her dr and was given meds  With codeine she thinks but she doesn't like it and it makes her sleepy

## 2016-11-06 ENCOUNTER — Telehealth: Payer: Self-pay | Admitting: *Deleted

## 2016-11-06 NOTE — Telephone Encounter (Signed)
Patient called in asking for date of MRI. Order has been placed but has not been scheduled. Provided number for scheduling at [facility] [phone]. Number on file for patient is correct. Kinnie Feil, RN, BSN

## 2016-11-06 NOTE — Telephone Encounter (Signed)
Patient went to ED as instructed, however, was told she could not get MRI unless brought in by EMS. Patient will call to schedule MRI, number to scheduling provided. Patient currently still having extreme headache and was not able to go to work today. Received note for missing work yesterday in ED but is requesting note from PCP for missing work today. Pt plans to return to work tomorrow. Kinnie Feil, RN, BSN

## 2016-11-07 ENCOUNTER — Ambulatory Visit (HOSPITAL_COMMUNITY): Payer: Medicaid Other

## 2016-11-11 ENCOUNTER — Encounter: Payer: Medicaid Other | Admitting: Family Medicine

## 2016-11-14 ENCOUNTER — Ambulatory Visit (HOSPITAL_COMMUNITY): Admission: RE | Admit: 2016-11-14 | Payer: Medicaid Other | Source: Ambulatory Visit

## 2016-11-25 ENCOUNTER — Ambulatory Visit (INDEPENDENT_AMBULATORY_CARE_PROVIDER_SITE_OTHER): Payer: Medicaid Other | Admitting: Family Medicine

## 2016-11-25 ENCOUNTER — Encounter: Payer: Self-pay | Admitting: Family Medicine

## 2016-11-25 VITALS — BP 96/60 | HR 80 | Temp 98.8°F | Ht 65.0 in | Wt 190.4 lb

## 2016-11-25 DIAGNOSIS — Z23 Encounter for immunization: Secondary | ICD-10-CM

## 2016-11-25 DIAGNOSIS — Z111 Encounter for screening for respiratory tuberculosis: Secondary | ICD-10-CM

## 2016-11-25 DIAGNOSIS — Z0000 Encounter for general adult medical examination without abnormal findings: Secondary | ICD-10-CM

## 2016-11-25 NOTE — Patient Instructions (Addendum)
Jill Dennis, you were seen today for an exam and to fill out some paper work.  You are doing very well.    Today we placed a PPD (for TB) and gave you a flu-shot.   Please follow up in the office on around noon-time on 10/18 or anytime 10/19.    It was very nice seeing you today, Jill Boom L. Myrtie Soman, MD [facility] Resident PGY-2 11/25/2016 9:53 AM

## 2016-11-25 NOTE — Progress Notes (Signed)
    Subjective:  Jill Dennis is a 21 y.o. female who presents to the [facility] today to have a health assessment form and physical completed  HPI:  21yo healthy female with no significant PMH. Denies any CP, SOB, NVD, problems with bladder or bowel function.  Going to be working in [employer] in [employer]. Here today for work clearance. UTD on vaccinations. Due for PPD and flu-shot. No concerns today.    PMH: none Tobacco use: 2 cigarettes per day Alcohol: minimal  Medication: reviewed and updated ROS: see HPI   Objective:  Physical Exam: BP 96/60   Pulse 80   Temp 98.8 F (37.1 C) (Oral)   Ht  (1.651 m)   Wt 190 lb 6.4 oz (86.4 kg)   LMP 11/03/2016   SpO2 97%   BMI 31.68 kg/m   Gen: 21 yo F in NAD, resting comfortably CV: RRR with no murmurs appreciated Pulm: NWOB, CTAB with no crackles, wheezes, or rhonchi GI: Normal bowel sounds present. Soft, Nontender, Nondistended. MSK: no edema, cyanosis, or clubbing noted Skin: warm, dry Neuro: grossly normal, moves all extremities Psych: Normal affect and thought content  No results found for this or any previous visit (from the past 72 hour(s)).   Assessment/Plan:  Routine physical exam Healthy 21yo F with no current medical complaints. Does smoke 2 cigarettes per day and denies current exercise. Recommended initiating an exercise regimen and smoking cessation.  PPD placed. Will follow up in 48-72 hours for check. Paper work completed. Follow up in one year or sooner.   Healthcare maintenance UTD. Received flu-shot today.   Jill Dennis L. Myrtie Soman, MD [facility] Resident PGY-2 11/25/2016 10:36 AM

## 2016-11-27 ENCOUNTER — Ambulatory Visit: Payer: Medicaid Other | Admitting: *Deleted

## 2016-11-27 ENCOUNTER — Encounter: Payer: Self-pay | Admitting: *Deleted

## 2016-11-27 DIAGNOSIS — Z111 Encounter for screening for respiratory tuberculosis: Secondary | ICD-10-CM

## 2016-11-27 LAB — TB SKIN TEST
INDURATION: 0 mm
TB SKIN TEST: NEGATIVE

## 2016-11-27 NOTE — Progress Notes (Signed)
Patient here today to have PPD site read.   PPD read and results entered in EpicCare. Result: 0 mm induration. Interpretation: Negative ~Jeannette Richardson, BSN, RN-BC    

## 2017-01-12 ENCOUNTER — Ambulatory Visit: Payer: Medicaid Other | Admitting: Family Medicine

## 2017-01-23 ENCOUNTER — Ambulatory Visit: Payer: Medicaid Other | Admitting: Family Medicine

## 2017-02-13 ENCOUNTER — Telehealth: Payer: Self-pay | Admitting: *Deleted

## 2017-02-13 NOTE — Telephone Encounter (Signed)
Patient left message on nurse line requesting rx for tamiflu as she has been exposed to someone with influenza. Kinnie FeilL. Ducatte, RN, BSN

## 2017-05-08 ENCOUNTER — Telehealth: Payer: Self-pay | Admitting: Family Medicine

## 2017-05-08 NOTE — Telephone Encounter (Signed)
Prescription for Motrin dated 09/26/16 was shredded on 05/08/16.

## 2017-05-18 ENCOUNTER — Ambulatory Visit (INDEPENDENT_AMBULATORY_CARE_PROVIDER_SITE_OTHER): Payer: Self-pay | Admitting: Internal Medicine

## 2017-05-18 ENCOUNTER — Other Ambulatory Visit (HOSPITAL_COMMUNITY)
Admission: RE | Admit: 2017-05-18 | Discharge: 2017-05-18 | Disposition: A | Discharge status: Home / Self Care | Payer: Medicaid Other | Source: Ambulatory Visit | Attending: Family Medicine | Admitting: Family Medicine

## 2017-05-18 ENCOUNTER — Other Ambulatory Visit: Payer: Self-pay

## 2017-05-18 ENCOUNTER — Encounter: Payer: Self-pay | Admitting: Internal Medicine

## 2017-05-18 VITALS — BP 114/68 | HR 83 | Temp 98.9°F | Ht 65.0 in | Wt 190.6 lb

## 2017-05-18 DIAGNOSIS — Z113 Encounter for screening for infections with a predominantly sexual mode of transmission: Secondary | ICD-10-CM | POA: Insufficient documentation

## 2017-05-18 DIAGNOSIS — N898 Other specified noninflammatory disorders of vagina: Secondary | ICD-10-CM

## 2017-05-18 LAB — POCT WET PREP (WET MOUNT)
CLUE CELLS WET PREP WHIFF POC: POSITIVE
Trichomonas Wet Prep HPF POC: ABSENT

## 2017-05-18 MED ORDER — METRONIDAZOLE 500 MG PO TABS: 500.0000 mg | ORAL_TABLET | Freq: Two times a day (BID) | ORAL | 0 refills | Status: DC

## 2017-05-18 NOTE — Progress Notes (Signed)
Subjective:   Patient: Jill Dennis       Birthdate: 1995-12-19       MRN: [id number]      HPI  Jill Dennis is a 22 y.o. female presenting for STD screening.   STD screening Patient wishes to be screened for STDs today. Doesn't think she has been exposed, but just wants to be sure. Endorses vaginal discharge which is similar to discharge she has had in the past when diagnosed with BV. Denies any vaginal itching or irritation. Endorses some small bumps in her genital area that are red and are sometimes painful. They only occur after she shaves. Denies nausea, vomiting, abd pain. Denies dysuria, hematuria. LMP 03/12. Is not on any form of birth control.   Smoking status reviewed. Patient is current smoker.   Review of Systems See HPI.     Objective:  Physical Exam  Constitutional: She is oriented to person, place, and time. She appears well-developed and well-nourished.  HENT:  Head: Normocephalic and atraumatic.  Cardiovascular: Normal rate.  Pulmonary/Chest: Effort normal. No respiratory distress.  Abdominal: Soft. She exhibits no distension. There is no tenderness.  Genitourinary: Vagina normal and uterus normal. Vaginal discharge: Moderate white.  Musculoskeletal: Normal range of motion.  Neurological: She is alert and oriented to person, place, and time.  Skin: Skin is warm and dry.  Psychiatric: She has a normal mood and affect. Her behavior is normal.      Assessment & Plan:  Vaginal discharge Mod amount of white vaginal discharge on exam. Pelvic exam otherwise without abnormalities. Wet prep with clue cells and bacteria. Will treat with Flagyl for BV. Will call patient when results of GC/chlamydia, HIV, and RPR have returned.    Tarri Abernethy, MD, MPH PGY-3 [facility] Pager [phone]

## 2017-05-18 NOTE — Assessment & Plan Note (Signed)
Mod amount of white vaginal discharge on exam. Pelvic exam otherwise without abnormalities. Wet prep with clue cells and bacteria. Will treat with Flagyl for BV. Will call patient when results of GC/chlamydia, HIV, and RPR have returned.

## 2017-05-18 NOTE — Patient Instructions (Addendum)
It was nice seeing you again today Jill Dennis!  I will call you with the results of your STD testing when they have returned.   If you have any questions or concerns, please feel free to call the clinic.   Be well,  Dr. Natale MilchLancaster

## 2017-05-19 ENCOUNTER — Ambulatory Visit: Payer: Medicaid Other | Admitting: Family Medicine

## 2017-05-19 LAB — CERVICOVAGINAL ANCILLARY ONLY
CHLAMYDIA, DNA PROBE: POSITIVE — AB
NEISSERIA GONORRHEA: NEGATIVE

## 2017-05-19 LAB — RPR: RPR: NONREACTIVE

## 2017-05-19 LAB — HIV ANTIBODY (ROUTINE TESTING W REFLEX): HIV Screen 4th Generation wRfx: NONREACTIVE

## 2017-05-20 ENCOUNTER — Telehealth: Payer: Self-pay | Admitting: Internal Medicine

## 2017-05-20 MED ORDER — AZITHROMYCIN 500 MG PO TABS: 1000.0000 mg | ORAL_TABLET | Freq: Once | ORAL | 0 refills | Status: AC

## 2017-05-20 NOTE — Telephone Encounter (Signed)
Called patient regarding positive chlamydia screening. No answer, left voicemail explaining positive screening. Also explained prescription for azithromycin, specifically to take two tabs at same time. Encouraged to call back with any questions.   Tarri AbernethyAbigail J Sherod Cisse, MD, MPH PGY-3 [facility] Pager [phone]

## 2017-05-20 NOTE — Telephone Encounter (Signed)
Pt called nurse line regarding Rx for azithromycin. States to get 2 pills from pharmacy is $36 and too expensive. Pt put on nurse clinic to come in for tx and Rx canceled at pharmacy. Will forward to MD. Shawna OrleansMeredith B Thomsen, RN

## 2017-05-21 ENCOUNTER — Ambulatory Visit (INDEPENDENT_AMBULATORY_CARE_PROVIDER_SITE_OTHER): Payer: Medicaid Other

## 2017-05-21 DIAGNOSIS — A749 Chlamydial infection, unspecified: Secondary | ICD-10-CM | POA: Diagnosis present

## 2017-05-21 MED ORDER — AZITHROMYCIN 500 MG PO TABS
500.0000 mg | ORAL_TABLET | Freq: Once | ORAL | Status: AC
  Administered 2017-05-21: 1000 mg via ORAL

## 2017-05-21 NOTE — Progress Notes (Signed)
Patient in nurse clinic today for STD treatment of chlamydia. Patient advised to abstain from sex for 7-10 days after treatment or when partner has been tested/treated.  Azithromycin 1 GM PO x 1  STD report form fax completed [facility] at [phone] (STD department).  Patient verbalized understanding.  Shawna OrleansMeredith B Cederic Mozley, RN

## 2017-09-04 ENCOUNTER — Ambulatory Visit: Payer: Medicaid Other | Admitting: Family Medicine

## 2017-09-18 ENCOUNTER — Ambulatory Visit: Payer: Medicaid Other | Admitting: Family Medicine

## 2017-09-22 ENCOUNTER — Ambulatory Visit (INDEPENDENT_AMBULATORY_CARE_PROVIDER_SITE_OTHER): Payer: Medicaid Other | Admitting: Family Medicine

## 2017-09-22 ENCOUNTER — Other Ambulatory Visit (HOSPITAL_COMMUNITY)
Admission: RE | Admit: 2017-09-22 | Discharge: 2017-09-22 | Disposition: A | Discharge status: Home / Self Care | Payer: Medicaid Other | Source: Ambulatory Visit | Attending: Family Medicine | Admitting: Family Medicine

## 2017-09-22 ENCOUNTER — Other Ambulatory Visit: Payer: Self-pay

## 2017-09-22 ENCOUNTER — Encounter: Payer: Self-pay | Admitting: Family Medicine

## 2017-09-22 VITALS — BP 102/72 | HR 95 | Temp 98.5°F | Ht 65.0 in | Wt 187.0 lb

## 2017-09-22 DIAGNOSIS — N898 Other specified noninflammatory disorders of vagina: Secondary | ICD-10-CM

## 2017-09-22 DIAGNOSIS — Z113 Encounter for screening for infections with a predominantly sexual mode of transmission: Secondary | ICD-10-CM

## 2017-09-22 LAB — POCT WET PREP (WET MOUNT)
CLUE CELLS WET PREP WHIFF POC: POSITIVE
Trichomonas Wet Prep HPF POC: ABSENT

## 2017-09-22 MED ORDER — METRONIDAZOLE 500 MG PO TABS: 500.0000 mg | ORAL_TABLET | Freq: Two times a day (BID) | ORAL | 0 refills | Status: AC

## 2017-09-22 NOTE — Progress Notes (Addendum)
Subjective:   Patient ID: Jill Dennis    DOB: [birth year], 22 y.o. female   MRN: [id number]  Chief Complaint: vaginal odor   History of Present Illness: Jill Dennis is a 22 y.o. woman who presents to clinic today complaining of intermittent vaginal odor and gray discharge.   Vaginal Odor and Discharge  Patient reports that these symptoms have been recurrent with her female partner of the last two years. Patient with history of positive gonorrhea and bacterial vaginosis in 05/2017; positive gonorrhea in her partner. Patient reports completing the antibiotic treatment for gonorrhea, but never received a prescription for BV. Patient has had a single female partner for the last two years; engages in vaginal intercourse and oral sex. Patient never used condoms with her partner. Patient reports history of gonorrhea in 05/2015. Patient is not interested in birth control; does not "believe in birth control." She has one child and does not currently wish to conceive. Patient is not concerned about STIs or pregnancy. Patient has not had sex since learning she had gonorrhea in 05/2017.   Review of Systems  Constitutional: Negative for chills and fever.  Genitourinary: Positive for vaginal discharge. Negative for dysuria, frequency and hematuria.   Social: pt is a current smoker, 1/4 PPD  Objective:   BP 102/72   Pulse 95   Temp 98.5 F (36.9 C) (Oral)   Ht 5\' 5"  (1.651 m)   Wt 187 lb (84.8 kg)   SpO2 98%   BMI 31.12 kg/m   Gen- 22 yo female, NAD  CV - RRR no MRG  Respiratory - CTAB, normal effort  Abdomen - soft, NTND, +bs  Pelvic exam: VULVA: normal appearing vulva with no masses, tenderness or lesions, VAGINA: normal appearing vagina with normal color and discharge, no lesions, CERVIX: normal appearing cervix without discharge or lesions. Neuro - face symmetric, patellar reflexes equal bilaterally  Microscopic wet-mount exam shows +Whiff test, +clue cells, white blood  cells.  Assessment & Plan:  Jill Dennis is a 22 y.o. female who presents to clinic today complaining of intermittent vaginal odor and gray discharge.  Vaginal discharge Patient's wet prep reveals psotive whiff test, clue cells and bacteria. Will treat bacterial vaginosis with metronidazole 500mg  bid for seven days. Informed patient that she will be contacted with her GC/Chlyamdia results. Patient declined HIV and RPR. Discussed with patient importance of barrier protection given that she does not want to conceive and is not interested in birth control.   Orders Placed This Encounter  Procedures  . POCT Wet Prep [facility])   Meds ordered this encounter  Medications  . metroNIDAZOLE (FLAGYL) 500 MG tablet    Sig: Take 1 tablet (500 mg total) by mouth 2 (two) times daily for 7 days.    Dispense:  14 tablet    Refill:  0   Follow up: PRN   Patient seen along with MS3 student Elveria Rising. I personally evaluated this patient along with the student, and verified all aspects of the history, physical exam, and medical decision making as documented by the student. I agree with the student's documentation and have made all necessary edits.  Freddrick March, MD  [facility] 09/30/2017 3:34 PM

## 2017-09-22 NOTE — Patient Instructions (Signed)
It was nice meeting you today.  You were seen in clinic for BV testing.  I will call you once I have the results of this and will send in the appropriate antibiotic to your pharmacy.  Please call clinic if you have any questions.  Be well, Freddrick MarchYashika Yulissa Needham MD

## 2017-09-22 NOTE — Assessment & Plan Note (Addendum)
Patient's wet prep reveals psotive whiff test, clue cells and bacteria. Will treat bacterial vaginosis with metronidazole 500mg  bid for seven days. Informed patient that she will be contacted with her GC/Chlyamdia results. Patient declined HIV and RPR. Discussed with patient importance of barrier protection given that she does not want to conceive and is not interested in birth control.

## 2017-09-23 LAB — CERVICOVAGINAL ANCILLARY ONLY
Chlamydia: NEGATIVE
NEISSERIA GONORRHEA: NEGATIVE

## 2018-01-31 ENCOUNTER — Emergency Department (HOSPITAL_COMMUNITY)
Admission: EM | Admit: 2018-01-31 | Discharge: 2018-01-31 | Disposition: A | Discharge status: Home / Self Care | Payer: Medicaid Other | Attending: Emergency Medicine | Admitting: Emergency Medicine

## 2018-01-31 ENCOUNTER — Encounter (HOSPITAL_COMMUNITY): Payer: Self-pay | Admitting: Emergency Medicine

## 2018-01-31 ENCOUNTER — Emergency Department (HOSPITAL_COMMUNITY): Payer: Medicaid Other

## 2018-01-31 DIAGNOSIS — M7989 Other specified soft tissue disorders: Secondary | ICD-10-CM | POA: Diagnosis not present

## 2018-01-31 DIAGNOSIS — F17210 Nicotine dependence, cigarettes, uncomplicated: Secondary | ICD-10-CM | POA: Insufficient documentation

## 2018-01-31 DIAGNOSIS — M25561 Pain in right knee: Secondary | ICD-10-CM | POA: Diagnosis not present

## 2018-01-31 MED ORDER — IBUPROFEN 200 MG PO TABS
600.0000 mg | ORAL_TABLET | Freq: Once | ORAL | Status: AC
  Administered 2018-01-31: 600 mg via ORAL
  Filled 2018-01-31: qty 3

## 2018-01-31 MED ORDER — IBUPROFEN 800 MG PO TABS: 800.0000 mg | ORAL_TABLET | Freq: Three times a day (TID) | ORAL | 0 refills | Status: DC | PRN

## 2018-01-31 MED ORDER — ACETAMINOPHEN 325 MG PO TABS
650.0000 mg | ORAL_TABLET | Freq: Once | ORAL | Status: AC
  Administered 2018-01-31: 650 mg via ORAL
  Filled 2018-01-31: qty 2

## 2018-01-31 NOTE — ED Provider Notes (Signed)
[facility]-EMERGENCY DEPT Provider Note   CSN: [id number] Arrival date & time: 01/31/18  7829     History   Chief Complaint Chief Complaint  Patient presents with  . Knee Pain    HPI DNIYAH GRANT is a 22 y.o. female.  HPI  22 year old female presents with right knee pain for about a week.  Over the last 24-48 hours it has been particularly worse.  It is very painful to walk.  She does not member any trauma but she states she bends a lot at work and thinks she might of injured it doing that.  She thinks there might be a little bit of swelling.  The pain is all anterior.  There is no posterior pain, calf pain or calf swelling.  No weakness in her leg.  She thought it was tingling last night but no tingling or numbness now.  She has taken 800 mg of ibuprofen intermittently, last took it at midnight.  Past Medical History:  Diagnosis Date  . Anemia 2013  . Heart murmur     Patient Active Problem List   Diagnosis Date Noted  . Headache 10/30/2016  . Acute upper respiratory infection 09/26/2016  . Right shoulder pain 08/18/2014  . Anemia 01/27/2012  . Abnormal genetic test in pregnancy 11/04/2011  . Vaginal discharge 04/23/2011  . Acne 05/09/2010    Past Surgical History:  Procedure Laterality Date  . NO PAST SURGERIES       OB History    Gravida  1   Para  1   Term  1   Preterm  0   AB  0   Living  1     SAB  0   TAB  0   Ectopic  0   Multiple  0   Live Births  1            Home Medications    Prior to Admission medications   Medication Sig Start Date End Date Taking? Authorizing Provider  albuterol (PROVENTIL HFA;VENTOLIN HFA) 108 (90 Base) MCG/ACT inhaler Inhale 1-2 puffs into the lungs every 6 (six) hours as needed for wheezing or shortness of breath. Patient not taking: Reported on 01/31/2018 09/23/16   Hedges, Tinnie Gens, PA-C  ibuprofen (ADVIL,MOTRIN) 800 MG tablet Take 1 tablet (800 mg total) by mouth every 8  (eight) hours as needed. 01/31/18   Pricilla Loveless, MD    Family History Family History  Problem Relation Age of Onset  . Hypertension Mother   . Diabetes Maternal Aunt   . Hypertension Maternal Aunt   . Thyroid disease Maternal Aunt   . Hypertension Maternal Uncle   . Diabetes Maternal Grandmother   . Diabetes Maternal Grandfather     Social History Social History   Tobacco Use  . Smoking status: Current Some Day Smoker    Packs/day: 0.25    Types: Cigarettes, Cigars  . Smokeless tobacco: Never Used  Substance Use Topics  . Alcohol use: Yes    Comment: social  . Drug use: No    Comment: pt denies     Allergies   Penicillins; Sulfa antibiotics; and Sulfonamide derivatives   Review of Systems Review of Systems  Constitutional: Negative for fever.  Musculoskeletal: Positive for arthralgias and joint swelling.  Skin: Negative for color change.  Neurological: Negative for weakness.     Physical Exam Updated Vital Signs BP 121/76 (BP Location: Left Arm)   Pulse 86   Temp 98.9 F (37.2  C) (Oral)   Resp 17   LMP 01/10/2018   SpO2 99%   Physical Exam Vitals signs and nursing note reviewed.  Constitutional:      General: She is not in acute distress.    Appearance: She is well-developed. She is not ill-appearing or diaphoretic.  HENT:     Head: Normocephalic and atraumatic.     Right Ear: External ear normal.     Left Ear: External ear normal.     Nose: Nose normal.  Eyes:     General:        Right eye: No discharge.        Left eye: No discharge.  Cardiovascular:     Rate and Rhythm: Normal rate and regular rhythm.     Pulses:          Dorsalis pedis pulses are 2+ on the right side and 2+ on the left side.  Pulmonary:     Effort: Pulmonary effort is normal.  Musculoskeletal:     Right knee: She exhibits decreased range of motion (active, full passive ROM) and swelling (mild). She exhibits no effusion and no erythema. Tenderness (anterior/bilateral -  no posterior tenderness) found.     Right upper leg: She exhibits no tenderness.     Right lower leg: She exhibits no tenderness and no swelling.     Comments: No increased warmth to right knee  Skin:    General: Skin is warm and dry.     Findings: No erythema.  Neurological:     Mental Status: She is alert.  Psychiatric:        Mood and Affect: Mood is not anxious.      ED Treatments / Results  Labs (all labs ordered are listed, but only abnormal results are displayed) Labs Reviewed - No data to display  EKG None  Radiology Dg Knee Complete 4 Views Right  Result Date: 01/31/2018 CLINICAL DATA:  Atraumatic knee pain 2 days EXAM: RIGHT KNEE - COMPLETE 4+ VIEW COMPARISON:  None. FINDINGS: No evidence of fracture, dislocation, or joint effusion. No evidence of arthropathy or other focal bone abnormality. Soft tissues are unremarkable. IMPRESSION: Negative. Electronically Signed   By: Marlan Palauharles  Clark M.D.   On: 01/31/2018 08:20    Procedures Procedures (including critical care time)  Medications Ordered in ED Medications  ibuprofen (ADVIL,MOTRIN) tablet 600 mg (600 mg Oral Given 01/31/18 0811)  acetaminophen (TYLENOL) tablet 650 mg (650 mg Oral Given 01/31/18 [id number])     Initial Impression / Assessment and Plan / ED Course  I have reviewed the triage vital signs and the nursing notes.  Pertinent labs & imaging results that were available during my care of the patient were reviewed by me and considered in my medical decision making (see chart for details).     While the patient's knee pain is atraumatic, I have very low suspicion for an acute infectious cause.  There is no joint effusion, erythema or warmth.  She has pain with range of motion of her knee but I am able to fully passively range her knee.  Given no calf pain or swelling and with pain reproducible on her knee joint, I think DVT is very unlikely.  She will be placed in a knee sleeve.  She is asking for crutches as  it is painful to walk.  She is also requesting 800 mg tablets of ibuprofen instead of using 4 200 mg tablets.  I have advised her to also use Tylenol.  She will be referred to orthopedics.  We discussed return precautions.  Final Clinical Impressions(s) / ED Diagnoses   Final diagnoses:  Acute pain of right knee    ED Discharge Orders         Ordered    ibuprofen (ADVIL,MOTRIN) 800 MG tablet  Every 8 hours PRN     01/31/18 0857           Pricilla LovelessGoldston, Marcy Bogosian, MD 01/31/18 [phone]

## 2018-01-31 NOTE — Discharge Instructions (Signed)
If you notice swelling to your knee, increased pain or inability to walk, redness or warmth to your knee, or any other new/concerning symptoms then return to the ER for evaluation.  Otherwise follow-up with the orthopedist.  You may continue to take ibuprofen and in addition you should take Tylenol.  Do not take more than 4000 mg of Tylenol per day.

## 2018-01-31 NOTE — ED Triage Notes (Signed)
Pt c/o right knee pain for about week. Denies injuries or falls.

## 2018-03-18 ENCOUNTER — Ambulatory Visit: Payer: Medicaid Other | Admitting: Family Medicine

## 2018-03-18 NOTE — Progress Notes (Deleted)
   Subjective:    Patient ID: Jill Dennis, female    DOB: 1995-10-12, 23 y.o.   MRN: [id number]   CC: STD check, f/u iron   HPI: [name] is a 23 year old female presenting to discuss the following:  STD check: Gonorrhea, chlamydia, RPR, HIV last checked in 09/2017, all negative at that time.  Follow-up iron: Hemoglobin 12.4 in 2018.   Health maintenance: Overdue for influenza vaccine  Smoking status reviewed  Review of Systems Per HPI, also denies recent illness, fever, headache, changes in vision, chest pain, shortness of breath, abdominal pain, N/V/D, weakness   Patient Active Problem List   Diagnosis Date Noted  . Headache 10/30/2016  . Acute upper respiratory infection 09/26/2016  . Right shoulder pain 08/18/2014  . Anemia 01/27/2012  . Abnormal genetic test in pregnancy 11/04/2011  . Vaginal discharge 04/23/2011  . Acne 05/09/2010     Objective:  There were no vitals taken for this visit. Vitals and nursing note reviewed  General: NAD, pleasant Cardiac: RRR, normal heart sounds, no murmurs Respiratory: CTAB, normal effort Abdomen: soft, nontender, nondistended Extremities: no edema or cyanosis. WWP. Skin: warm and dry, no rashes noted Neuro: alert and oriented, no focal deficits Psych: normal affect  Assessment & Plan:    No problem-specific Assessment & Plan notes found for this encounter.    Leticia Penna, DO Family Medicine Resident PGY-1

## 2018-03-25 ENCOUNTER — Ambulatory Visit (INDEPENDENT_AMBULATORY_CARE_PROVIDER_SITE_OTHER): Payer: Medicaid Other | Admitting: Family Medicine

## 2018-03-25 ENCOUNTER — Telehealth: Payer: Self-pay

## 2018-03-25 ENCOUNTER — Encounter: Payer: Self-pay | Admitting: Family Medicine

## 2018-03-25 ENCOUNTER — Other Ambulatory Visit: Payer: Self-pay

## 2018-03-25 ENCOUNTER — Other Ambulatory Visit (HOSPITAL_COMMUNITY)
Admission: RE | Admit: 2018-03-25 | Discharge: 2018-03-25 | Disposition: A | Discharge status: Home / Self Care | Payer: Medicaid Other | Source: Ambulatory Visit | Attending: Family Medicine | Admitting: Family Medicine

## 2018-03-25 VITALS — BP 114/62 | HR 96 | Temp 98.5°F | Ht 65.0 in | Wt 189.8 lb

## 2018-03-25 DIAGNOSIS — B9689 Other specified bacterial agents as the cause of diseases classified elsewhere: Secondary | ICD-10-CM | POA: Diagnosis not present

## 2018-03-25 DIAGNOSIS — Z113 Encounter for screening for infections with a predominantly sexual mode of transmission: Secondary | ICD-10-CM | POA: Diagnosis not present

## 2018-03-25 DIAGNOSIS — N760 Acute vaginitis: Secondary | ICD-10-CM | POA: Diagnosis not present

## 2018-03-25 DIAGNOSIS — D509 Iron deficiency anemia, unspecified: Secondary | ICD-10-CM | POA: Diagnosis not present

## 2018-03-25 LAB — POCT WET PREP (WET MOUNT)
Clue Cells Wet Prep Whiff POC: NEGATIVE
Trichomonas Wet Prep HPF POC: ABSENT

## 2018-03-25 NOTE — Telephone Encounter (Signed)
Patient called nurse line stating she was returning call. Gave wet prep results from this morning.  Ples SpecterAlisa Jacobus Colvin, RN [facility])

## 2018-03-25 NOTE — Telephone Encounter (Signed)
Contacted patient and informed her that test results were normal per Dr. Annia Friendly.  Glennie Hawk, CMA

## 2018-03-25 NOTE — Assessment & Plan Note (Signed)
History of anemia, however most recent CBC in 2018 within normal limits and normocytic.  Patient would like this reevaluated today because she is continually cold. - Obtain CBC - TSH

## 2018-03-25 NOTE — Patient Instructions (Addendum)
It was wonderful seeing you today!  I will let you know the results of your blood tests in the next week.  Please make sure you follow-up in June for your Pap smear, or sooner if needed. Continue to practice proper hygiene, drink plenty of water, have probiotic/yogurt, and avoid douching.

## 2018-03-25 NOTE — Progress Notes (Signed)
   Subjective:    Patient ID: Regino Bellow, female    DOB: 02/25/1995, 23 y.o.   MRN: [id number]   CC: f/u iron and STD screen   HPI: Ms. Dina is a 23 year old female presenting to discuss the following:  Low iron: She been told in the past that she had anemia, likely due to iron deficiency.  She does want to reevaluate this now as she says she often feels cold.  Note she does have heavy menstrual cycles regularly.  Last hemoglobin in 09/2016 was 12.4 and normocytic.  Denies dizziness, lightheadedness, shortness of breath.  STD screening: States she comes regularly just to make sure she does not have any STDs.  Has had one sexual partner in the last 6 months.  Not in a monogamous relationship.  Did not use protection.  She denies any change in her vaginal discharge color, consistency, odor.  Additionally denies any dysuria, urinary frequency, dyspareunia, vaginal itching, or vaginal irritation, abdominal pain.  She is also curious about why she recurrently gets bacterial vaginosis.  Had this documented twice in the last year.   Smoking status reviewed  Review of Systems Per HPI, also denies recent illness, fever, headache, changes in vision, chest pain, shortness of breath, abdominal pain, N/V/D, weakness   Patient Active Problem List   Diagnosis Date Noted  . Screen for STD (sexually transmitted disease) 03/25/2018  . BV (bacterial vaginosis) 03/25/2018  . Headache 10/30/2016  . Acute upper respiratory infection 09/26/2016  . Right shoulder pain 08/18/2014  . Anemia 01/27/2012  . Abnormal genetic test in pregnancy 11/04/2011  . Vaginal discharge 04/23/2011  . Acne 05/09/2010     Objective:  BP 114/62   Pulse 96   Temp 98.5 F (36.9 C) (Oral)   Ht 5\' 5"  (1.651 m)   Wt 189 lb 12.8 oz (86.1 kg)   LMP 03/08/2018 (Approximate)   SpO2 96%   BMI 31.58 kg/m  Vitals and nursing note reviewed  General: NAD, pleasant Cardiac: RRR, normal heart sounds Respiratory: CTAB,  normal effort Abdomen: soft, nontender, nondistended Extremities: no edema or cyanosis. WWP. Skin: warm and dry, no rashes noted Neuro: alert and oriented, no focal deficits Psych: normal affect GU: Normal appearance of external genitalia, vagina, cervix.  No lesions, vesicles, or abnormalities noted.  Thin white discharge in vaginal vault.  No cervical motion tenderness.  Wet prep and GC/chlamydia collected.  Assessment & Plan:   Screen for STD (sexually transmitted disease) Collected wet prep, GC/chlamydia, HIV, RPR.  GU exam without abnormalities, no vesicles or lesions to suggest herpes on exam.  Encouraged patient to use barrier protection in the future to prevent from STDs.  Anemia History of anemia, however most recent CBC in 2018 within normal limits and normocytic.  Patient would like this reevaluated today because she is continually cold. - Obtain CBC - TSH  BV (bacterial vaginosis) History of bacterial vaginosis, 2x in the last year.  Encourage patient to stay well-hydrated, practice proper hygiene, probiotics and diet/yogurt, and avoid douching.  Can consider standing MetroGel order if patient continues to have recurrent BV.  Currently asymptomatic.  Follow-up in a few months for Pap smear.  Leticia Penna, DO Family Medicine Resident PGY-1

## 2018-03-25 NOTE — Assessment & Plan Note (Signed)
History of bacterial vaginosis, 2x in the last year.  Encourage patient to stay well-hydrated, practice proper hygiene, probiotics and diet/yogurt, and avoid douching.  Can consider standing MetroGel order if patient continues to have recurrent BV.  Currently asymptomatic.

## 2018-03-25 NOTE — Assessment & Plan Note (Signed)
Collected wet prep, GC/chlamydia, HIV, RPR.  GU exam without abnormalities, no vesicles or lesions to suggest herpes on exam.  Encouraged patient to use barrier protection in the future to prevent from STDs.

## 2018-03-26 LAB — CBC WITH DIFFERENTIAL/PLATELET
BASOS ABS: 0 10*3/uL (ref 0.0–0.2)
Basos: 1 %
EOS (ABSOLUTE): 0.2 10*3/uL (ref 0.0–0.4)
Eos: 4 %
Hematocrit: 33.7 % — ABNORMAL LOW (ref 34.0–46.6)
Hemoglobin: 11.3 g/dL (ref 11.1–15.9)
Immature Grans (Abs): 0 10*3/uL (ref 0.0–0.1)
Immature Granulocytes: 1 %
Lymphocytes Absolute: 1.4 10*3/uL (ref 0.7–3.1)
Lymphs: 35 %
MCH: 29.4 pg (ref 26.6–33.0)
MCHC: 33.5 g/dL (ref 31.5–35.7)
MCV: 88 fL (ref 79–97)
Monocytes Absolute: 0.4 10*3/uL (ref 0.1–0.9)
Monocytes: 9 %
NEUTROS PCT: 50 %
Neutrophils Absolute: 2.1 10*3/uL (ref 1.4–7.0)
Platelets: 256 10*3/uL (ref 150–450)
RBC: 3.84 x10E6/uL (ref 3.77–5.28)
RDW: 12.1 % (ref 11.7–15.4)
WBC: 4.1 10*3/uL (ref 3.4–10.8)

## 2018-03-26 LAB — CERVICOVAGINAL ANCILLARY ONLY
Chlamydia: NEGATIVE
Neisseria Gonorrhea: NEGATIVE

## 2018-03-26 LAB — HIV ANTIBODY (ROUTINE TESTING W REFLEX): HIV Screen 4th Generation wRfx: NONREACTIVE

## 2018-03-26 LAB — TSH: TSH: 1.36 u[IU]/mL (ref 0.450–4.500)

## 2018-03-26 LAB — RPR: RPR Ser Ql: NONREACTIVE

## 2018-03-28 ENCOUNTER — Ambulatory Visit (HOSPITAL_COMMUNITY)
Admission: EM | Admit: 2018-03-28 | Discharge: 2018-03-28 | Disposition: A | Discharge status: Home / Self Care | Payer: Medicaid Other | Attending: Family Medicine | Admitting: Family Medicine

## 2018-03-28 ENCOUNTER — Encounter (HOSPITAL_COMMUNITY): Payer: Self-pay | Admitting: Emergency Medicine

## 2018-03-28 DIAGNOSIS — J039 Acute tonsillitis, unspecified: Secondary | ICD-10-CM | POA: Diagnosis not present

## 2018-03-28 LAB — POCT RAPID STREP A: Streptococcus, Group A Screen (Direct): NEGATIVE

## 2018-03-28 MED ORDER — CEPHALEXIN 500 MG PO CAPS: 500.0000 mg | ORAL_CAPSULE | Freq: Two times a day (BID) | ORAL | 0 refills | Status: DC

## 2018-03-28 NOTE — ED Provider Notes (Signed)
[facility]    CSN: [id number] Arrival date & time: 03/28/18  1043     History   Chief Complaint Chief Complaint  Patient presents with  . Sore Throat  . Otalgia    HPI Jill Dennis is a 23 y.o. female.   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  There is been strep throat at work.  She has a severe sore throat and some ear pain that is worsening over 4 days.  She has painful swallowing.  No headache.  No abdominal pain.  She has not taken her temperature but has felt warm.  He states she has had multiple strep infections and feels like she might have strep throat.  Past Medical History:  Diagnosis Date  . Anemia 2013  . Heart murmur     Patient Active Problem List   Diagnosis Date Noted  . Screen for STD (sexually transmitted disease) 03/25/2018  . BV (bacterial vaginosis) 03/25/2018  . Headache 10/30/2016  . Acute upper respiratory infection 09/26/2016  . Right shoulder pain 08/18/2014  . Anemia 01/27/2012  . Abnormal genetic test in pregnancy 11/04/2011  . Vaginal discharge 04/23/2011  . Acne 05/09/2010    Past Surgical History:  Procedure Laterality Date  . NO PAST SURGERIES      OB History    Gravida  1   Para  1   Term  1   Preterm  0   AB  0   Living  1     SAB  0   TAB  0   Ectopic  0   Multiple  0   Live Births  1            Home Medications    Prior to Admission medications   Medication Sig Start Date End Date Taking? Authorizing Provider  cephALEXin (KEFLEX) 500 MG capsule Take 1 capsule (500 mg total) by mouth 2 (two) times daily. 03/28/18   Eustace MooreNelson, Paiten Boies Sue, MD  ibuprofen (ADVIL,MOTRIN) 800 MG tablet Take 1 tablet (800 mg total) by mouth every 8 (eight) hours as needed. 01/31/18   Pricilla LovelessGoldston, Scott, MD    Family History Family History  Problem Relation Age of Onset  . Hypertension Mother   . Diabetes Maternal Aunt   . Hypertension Maternal Aunt   . Thyroid disease Maternal Aunt   . Hypertension  Maternal Uncle   . Diabetes Maternal Grandmother   . Diabetes Maternal Grandfather     Social History Social History   Tobacco Use  . Smoking status: Current Some Day Smoker    Packs/day: 0.25    Types: Cigarettes, Cigars  . Smokeless tobacco: Never Used  Substance Use Topics  . Alcohol use: Yes    Comment: social  . Drug use: No    Comment: pt denies     Allergies   Penicillins; Sulfa antibiotics; and Sulfonamide derivatives   Review of Systems Review of Systems  Constitutional: Positive for fever. Negative for chills.  HENT: Positive for ear pain, sore throat and trouble swallowing.   Eyes: Negative for pain and visual disturbance.  Respiratory: Negative for cough and shortness of breath.   Cardiovascular: Negative for chest pain and palpitations.  Gastrointestinal: Negative for abdominal pain and vomiting.  Genitourinary: Negative for dysuria and hematuria.  Musculoskeletal: Negative for arthralgias and back pain.  Skin: Negative for color change and rash.  Neurological: Negative for seizures and syncope.  All other systems reviewed and are negative.  Physical Exam Triage Vital Signs ED Triage Vitals [03/28/18 1154]  Enc Vitals Group     BP (!) 114/55     Pulse Rate 87     Resp 18     Temp 97.8 F (36.6 C)     Temp src      SpO2 99 %     Weight      Height      Head Circumference      Peak Flow      Pain Score 8     Pain Loc      Pain Edu?      Excl. in GC?    No data found.  Updated Vital Signs BP (!) 114/55   Pulse 87   Temp 97.8 F (36.6 C)   Resp 18   LMP 03/08/2018 (Approximate)   SpO2 99%   Visual Acuity Right Eye Distance:   Left Eye Distance:   Bilateral Distance:    Right Eye Near:   Left Eye Near:    Bilateral Near:     Physical Exam Constitutional:      General: She is not in acute distress.    Appearance: She is well-developed.  HENT:     Head: Normocephalic and atraumatic.     Right Ear: Tympanic membrane and ear  canal normal.     Left Ear: Tympanic membrane and ear canal normal.     Mouth/Throat:     Mouth: Mucous membranes are moist.     Pharynx: Posterior oropharyngeal erythema present.     Tonsils: No tonsillar exudate. Swelling: 2+ on the right. 2+ on the left.     Comments: Slight muffled voice.  Tonsils large and red.  No exudate Eyes:     Conjunctiva/sclera: Conjunctivae normal.     Pupils: Pupils are equal, round, and reactive to light.  Neck:     Musculoskeletal: Normal range of motion.  Cardiovascular:     Rate and Rhythm: Normal rate and regular rhythm.  Pulmonary:     Effort: Pulmonary effort is normal. No respiratory distress.     Breath sounds: Normal breath sounds.  Abdominal:     General: There is no distension.     Palpations: Abdomen is soft.  Musculoskeletal: Normal range of motion.  Lymphadenopathy:     Cervical: Cervical adenopathy present.  Skin:    General: Skin is warm and dry.  Neurological:     Mental Status: She is alert.      UC Treatments / Results  Labs (all labs ordered are listed, but only abnormal results are displayed) Labs Reviewed  CULTURE, GROUP A STREP [facility])  POCT RAPID STREP A    EKG None  Radiology No results found.  Procedures Procedures (including critical care time)  Medications Ordered in UC Medications - No data to display  Initial Impression / Assessment and Plan / UC Course  I have reviewed the triage vital signs and the nursing notes.  Pertinent labs & imaging results that were available during my care of the patient were reviewed by me and considered in my medical decision making (see chart for details).     Even though strep test is negative, she does have a pain full throat with erythema.  No exudate.  With exposure I think it is reasonable to cover her with antibiotics until the strep culture is back.  She is told to call in 3 days.  She is informed that she can stop the antibiotics early if  her culture is  negative.  If positive she needs 10 full days of antibiotics. Final Clinical Impressions(s) / UC Diagnoses   Final diagnoses:  Tonsillitis     Discharge Instructions     Take 10 days of antibiotic If culture is negative, may stop when you feel better   ED Prescriptions    Medication Sig Dispense Auth. Provider   cephALEXin (KEFLEX) 500 MG capsule Take 1 capsule (500 mg total) by mouth 2 (two) times daily. 20 capsule Eustace Moore, MD     Controlled Substance Prescriptions [place] Controlled Substance Registry consulted? no   Eustace Moore, MD 03/28/18 2106

## 2018-03-28 NOTE — Discharge Instructions (Signed)
Take 10 days of antibiotic If culture is negative, may stop when you feel better

## 2018-03-28 NOTE — ED Triage Notes (Signed)
Pt c/o sore throat and bilateral ear pain x4 days. No fever.

## 2018-03-30 LAB — CULTURE, GROUP A STREP (THRC)

## 2018-09-14 ENCOUNTER — Ambulatory Visit: Payer: Medicaid Other | Admitting: Family Medicine

## 2018-09-14 NOTE — Progress Notes (Deleted)
   Subjective:    Patient ID: Jill Dennis, female    DOB: Jun 12, 1995, 23 y.o.   MRN: [id number]   CC: pap/STD  HPI:   Smoking status reviewed  Review of Systems Per HPI, also denies recent illness, fever, headache, changes in vision, chest pain, shortness of breath, abdominal pain, N/V/D, weakness   Patient Active Problem List   Diagnosis Date Noted  . Screen for STD (sexually transmitted disease) 03/25/2018  . BV (bacterial vaginosis) 03/25/2018  . Headache 10/30/2016  . Acute upper respiratory infection 09/26/2016  . Right shoulder pain 08/18/2014  . Anemia 01/27/2012  . Abnormal genetic test in pregnancy 11/04/2011  . Vaginal discharge 04/23/2011  . Acne 05/09/2010     Objective:  There were no vitals taken for this visit. Vitals and nursing note reviewed  General: NAD, pleasant Cardiac: RRR, normal heart sounds, no murmurs Respiratory: CTAB, normal effort Abdomen: soft, nontender, nondistended Extremities: no edema or cyanosis. WWP. Skin: warm and dry, no rashes noted Neuro: alert and oriented, no focal deficits Psych: normal affect  Assessment & Plan:    No problem-specific Assessment & Plan notes found for this encounter.    [place] Medicine Resident PGY-2

## 2018-10-04 DIAGNOSIS — K0889 Other specified disorders of teeth and supporting structures: Secondary | ICD-10-CM | POA: Diagnosis not present

## 2018-11-18 DIAGNOSIS — M2630 Unspecified anomaly of tooth position of fully erupted tooth or teeth: Secondary | ICD-10-CM | POA: Diagnosis not present

## 2018-12-13 ENCOUNTER — Other Ambulatory Visit: Payer: Self-pay

## 2018-12-13 ENCOUNTER — Ambulatory Visit (INDEPENDENT_AMBULATORY_CARE_PROVIDER_SITE_OTHER): Payer: Medicaid Other | Admitting: Family Medicine

## 2018-12-13 ENCOUNTER — Other Ambulatory Visit (HOSPITAL_COMMUNITY)
Admission: RE | Admit: 2018-12-13 | Discharge: 2018-12-13 | Disposition: A | Discharge status: Home / Self Care | Payer: Medicaid Other | Source: Ambulatory Visit | Attending: Family Medicine | Admitting: Family Medicine

## 2018-12-13 ENCOUNTER — Encounter: Payer: Self-pay | Admitting: Family Medicine

## 2018-12-13 VITALS — BP 115/80 | HR 97 | Wt 188.8 lb

## 2018-12-13 DIAGNOSIS — N920 Excessive and frequent menstruation with regular cycle: Secondary | ICD-10-CM

## 2018-12-13 DIAGNOSIS — N898 Other specified noninflammatory disorders of vagina: Secondary | ICD-10-CM | POA: Diagnosis not present

## 2018-12-13 DIAGNOSIS — N760 Acute vaginitis: Secondary | ICD-10-CM | POA: Diagnosis not present

## 2018-12-13 DIAGNOSIS — B9689 Other specified bacterial agents as the cause of diseases classified elsewhere: Secondary | ICD-10-CM | POA: Diagnosis not present

## 2018-12-13 DIAGNOSIS — Z0000 Encounter for general adult medical examination without abnormal findings: Secondary | ICD-10-CM | POA: Diagnosis not present

## 2018-12-13 DIAGNOSIS — L731 Pseudofolliculitis barbae: Secondary | ICD-10-CM | POA: Insufficient documentation

## 2018-12-13 LAB — POCT WET PREP (WET MOUNT)
Clue Cells Wet Prep Whiff POC: POSITIVE
Trichomonas Wet Prep HPF POC: ABSENT

## 2018-12-13 LAB — POCT URINE PREGNANCY: Preg Test, Ur: NEGATIVE

## 2018-12-13 MED ORDER — METRONIDAZOLE 0.75 % VA GEL: 1.0000 | Freq: Two times a day (BID) | VAGINAL | 0 refills | Status: DC

## 2018-12-13 NOTE — Assessment & Plan Note (Signed)
Acute. Improving. -Continue warm compresses -Refrain from shaving while skin heals -keep area clean and dry -F/u PRN or symptoms worsen

## 2018-12-13 NOTE — Assessment & Plan Note (Addendum)
Chronic. Last treated 09/22/2017 with flagyl 500mg  BID x7 days. Wet mount today with many bacteria and moderate clue cells with + whiff test. -Pap done today -GC and chlamydia DNA  probe sent to lab -Vaginal Metrogel BID for 4 days -Abstain from coitus during treatment

## 2018-12-13 NOTE — Progress Notes (Signed)
   Subjective:    Patient ID: Jill Dennis, female    DOB: 1995/10/31, 23 y.o.   MRN: [id number]   CC: vaginal odor and pubic bumps  HPI: Jill Dennis is presenting today because of persistent bumps in her pubic area that only seem to be painful for 1 day. She does occasionally shave her pubic area but has not shave for 1 month since the bump started when hair was growing back. She has been using warm compresses and only trimming with scissors while the bump heals. This seems to be helping.  She is also concerned about a strong, fishy vaginal odor with cloudy discharge for 1 month. She says if she gets up from sitting down at work the smell is so strong. She fears she left a tampon in because that's what she read on the Internet. Her LMP was 11/20/2018 with some spotting and is usually heavy. Last heavy period was in August. She uses dove sensitive soap on the vaginal area and rubs a minty oil on the inside of her thighs because they rub when she walks. She is sexually active with only 1 female and last female sexual encounter was last year. She does share insertional toys with her female partner but says she properly cleans them before next use. Denies pelvic pain. Would like pap and STI testing today.  Smoking status reviewed  Review of Systems Per HPI, also denies recent illness, fever, headache, changes in vision, chest pain, shortness of breath, abdominal pain, N/V/D, weakness   Patient Active Problem List   Diagnosis Date Noted  . Screen for STD (sexually transmitted disease) 03/25/2018  . BV (bacterial vaginosis) 03/25/2018  . Headache 10/30/2016  . Acute upper respiratory infection 09/26/2016  . Right shoulder pain 08/18/2014  . Anemia 01/27/2012  . Abnormal genetic test in pregnancy 11/04/2011  . Vaginal discharge 04/23/2011  . Acne 05/09/2010     Objective:  BP 115/80   Pulse 97   Wt 188 lb 12.8 oz (85.6 kg)   LMP 11/17/2018 (Approximate)   SpO2 99%   BMI 31.42 kg/m   Vitals and nursing note reviewed  General: Appears well, no acute distress. Age appropriate. Cardiac: RRR, normal heart sounds, no murmurs Respiratory: CTAB, normal effort Abdomen: soft, nontender, nondistended Extremities: No edema or cyanosis. Skin: Warm and dry, no rashes noted. 1 light skin fleshed colored raised lesion, non erythematous and no drainage. Neuro: alert and oriented, no focal deficits Psych: normal affect Pelvic Exam: VULVA: normal appearing vulva with no masses, tenderness or lesions, VAGINA: vaginal discharge - white and copious, CERVIX: friable, WET MOUNT done - results: clue cells, positive whiff test. DNA probe for chlamydia and GC obtained, DNA probe for chlamydia and GC obtained, cervical motion tenderness present, no palpable internal organs, PAP: Pap smear done today, exam chaperoned by Shelly.   Assessment & Plan:    BV (bacterial vaginosis) Chronic. Last treated 09/22/2017 with flagyl 500mg  BID x7 days. Wet mount today with many bacteria and moderate clue cells with + whiff test. -Pap done today -GC and chlamydia DNA  probe sent to lab -Vaginal Metrogel BID for 4 days -Abstain from coitus during treatment  Ingrown hair Acute. Improving. -Continue warm compresses -Refrain from shaving while skin heals -keep area clean and dry -F/u PRN or symptoms worsen     Gerlene Fee, DO [place] PGY-1

## 2018-12-13 NOTE — Patient Instructions (Signed)
It was very nice to meet you today. Please enjoy the rest of your week. Today you were seen for bumps in the pubic area and vaginal ordor. We performed your pap smear and STI testing. Follow up with your PCP as needed.   Please call the clinic at [phone] if your symptoms worsen or you have any concerns. It was our pleasure to serve you.   Ingrown Hair  An ingrown hair is a hair that curls and re-enters the skin instead of growing straight out of the skin. An ingrown hair can develop in any part of the skin that hair is removed from. An ingrown hair may cause small pockets of infection. What are the causes? An ingrown hair may be caused by:  Shaving.  Tweezing.  Waxing.  Using a hair removal cream. What increases the risk? You are more likely to develop this condition if you have curly hair. What are the signs or symptoms? Symptoms of an ingrown hair may include:  Small bumps on the skin. The bumps may be filled with pus.  Pain.  Itching. How is this diagnosed? An ingrown hair is diagnosed by a skin exam. How is this treated? Treatment is often not needed unless the ingrown hair has caused an infection. If needed, treatment may include:  Applying prescription creams to the skin. This can help with inflammation.  Applying warm compresses to the skin. This can help soften the skin.  Taking antibiotic medicine. An antibiotic may be prescribed if the infection is severe.  Retracting and releasing the ingrown hair tips. Follow these instructions at home:  Medicines  Take, apply, or use over-the-counter and prescription medicines only as told by your health care provider. This includes any prescription creams.  If you were prescribed an antibiotic medicine, take it as told by your health care provider. Do not stop using the antibiotic even if you start to feel better. General instructions  Do not shave irritated areas of skin. You may start shaving these areas again  once the irritation has gone away.  To help remove ingrown hairs on your face, you may use a facial sponge in a gentle circular motion.  Do not pick or squeeze the irritated area of skin as this may cause infection and scarring. Managing pain and swelling  If directed, apply heat to the affected area as often as told by your health care provider. Use the heat source that your health care provider recommends, such as a moist heat pack or a heating pad. ? Place a towel between your skin and the heat source. ? Leave the heat on for 20-30 minutes. ? Remove the heat if your skin turns bright red. This is especially important if you are unable to feel pain, heat, or cold. You may have a greater risk of getting burned. How is this prevented?  Shower before shaving.  Wrap areas that you are going to shave in warm, moist wraps for several minutes before shaving. The warmth and moisture help to soften the hairs and makes ingrown hairs less likely.  Use thick shaving gels.  Use a razor that cuts hair slightly above your skin, or use an electric shaver with a long shave setting.  Shave in the direction of hair growth.  Avoid making multiple razor strokes.  Apply a moisturizing lotion after shaving. Summary  An ingrown hair is a hair that curls and re-enters the skin instead of growing straight out of the skin.  Treatment is often not needed  unless the ingrown hair has caused an infection.  Take, apply, or use over-the-counter and prescription medicines only as told by your health care provider. This includes any prescription creams.  Do not shave irritated areas of skin. You may start shaving these areas again once the irritation has gone away.  If directed, apply heat to the affected area. Use the heat source that your health care provider recommends, such as a moist heat pack or a heating pad. This information is not intended to replace advice given to you by your health care provider.  Make sure you discuss any questions you have with your health care provider. Document Released: 05/05/2000 Document Revised: 08/05/2017 Document Reviewed: 08/05/2017 Elsevier Patient Education  2020 [employer].

## 2018-12-14 LAB — CERVICOVAGINAL ANCILLARY ONLY
Chlamydia: NEGATIVE
Comment: NEGATIVE
Comment: NORMAL
Neisseria Gonorrhea: NEGATIVE

## 2018-12-14 LAB — HIV ANTIBODY (ROUTINE TESTING W REFLEX): HIV Screen 4th Generation wRfx: NONREACTIVE

## 2018-12-16 LAB — CYTOLOGY - PAP: Diagnosis: NEGATIVE

## 2018-12-23 DIAGNOSIS — U071 COVID-19: Secondary | ICD-10-CM | POA: Diagnosis not present

## 2018-12-26 ENCOUNTER — Emergency Department (HOSPITAL_COMMUNITY)
Admission: EM | Admit: 2018-12-26 | Discharge: 2018-12-26 | Disposition: A | Discharge status: Home / Self Care | Payer: Medicaid Other | Attending: Emergency Medicine | Admitting: Emergency Medicine

## 2018-12-26 ENCOUNTER — Encounter (HOSPITAL_COMMUNITY): Payer: Self-pay | Admitting: Emergency Medicine

## 2018-12-26 ENCOUNTER — Emergency Department (HOSPITAL_COMMUNITY): Payer: Medicaid Other

## 2018-12-26 ENCOUNTER — Other Ambulatory Visit: Payer: Self-pay

## 2018-12-26 DIAGNOSIS — R079 Chest pain, unspecified: Secondary | ICD-10-CM | POA: Insufficient documentation

## 2018-12-26 DIAGNOSIS — F17210 Nicotine dependence, cigarettes, uncomplicated: Secondary | ICD-10-CM | POA: Insufficient documentation

## 2018-12-26 DIAGNOSIS — U071 COVID-19: Secondary | ICD-10-CM | POA: Insufficient documentation

## 2018-12-26 DIAGNOSIS — F17290 Nicotine dependence, other tobacco product, uncomplicated: Secondary | ICD-10-CM | POA: Diagnosis not present

## 2018-12-26 DIAGNOSIS — R0789 Other chest pain: Secondary | ICD-10-CM | POA: Diagnosis not present

## 2018-12-26 LAB — I-STAT BETA HCG BLOOD, ED (MC, WL, AP ONLY): I-stat hCG, quantitative: <5 m[IU]/mL (ref <5)

## 2018-12-26 MED ORDER — HYDROXYZINE HCL 50 MG PO TABS: 50.0000 mg | ORAL_TABLET | Freq: Four times a day (QID) | ORAL | 1 refills | Status: DC | PRN

## 2018-12-26 NOTE — ED Provider Notes (Signed)
[facility] EMERGENCY DEPARTMENT Provider Note   CSN: [id number] Arrival date & time: 12/26/18  1616     History   Chief Complaint Chief Complaint  Patient presents with  . COVID  . Chest Pain    x 1 week    HPI Jill Dennis is a 23 y.o. female with no relevant past medical history presents to the ED with reports of intermittent, fleeting chest discomfort.  Patient states that she tested positive for COVID-19 this morning and endorses also some generalized body aches, and intermittent nausea in addition to her complaint of central chest discomfort.  She believes that her symptoms are related to anxiety, but she read online that chest pain with COVID-19 could suggest something far more concerning.  She presents to the ED accompanied by her son who has also been symptomatic.  She states that she has been anxious due to not knowing where to keep her son if she needs to isolate.  She also is upset that she had finally obtained employment and she now needs to isolate.  She denies any current fever or chills, respiratory difficulty, dyspnea on exertion, history of clots, leg swelling, clotting disorder, or other symptoms.    HPI  Past Medical History:  Diagnosis Date  . Anemia 2013  . Heart murmur     Patient Active Problem List   Diagnosis Date Noted  . Ingrown hair 12/13/2018  . Screen for STD (sexually transmitted disease) 03/25/2018  . BV (bacterial vaginosis) 03/25/2018  . Headache 10/30/2016  . Acute upper respiratory infection 09/26/2016  . Right shoulder pain 08/18/2014  . Anemia 01/27/2012  . Abnormal genetic test in pregnancy 11/04/2011  . Vaginal discharge 04/23/2011  . Acne 05/09/2010    Past Surgical History:  Procedure Laterality Date  . NO PAST SURGERIES       OB History    Gravida  1   Para  1   Term  1   Preterm  0   AB  0   Living  1     SAB  0   TAB  0   Ectopic  0   Multiple  0   Live Births  1            Home Medications    Prior to Admission medications   Medication Sig Start Date End Date Taking? Authorizing Provider  cephALEXin (KEFLEX) 500 MG capsule Take 1 capsule (500 mg total) by mouth 2 (two) times daily. 03/28/18   Eustace Moore, MD  hydrOXYzine (ATARAX/VISTARIL) 50 MG tablet Take 1 tablet (50 mg total) by mouth every 6 (six) hours as needed for anxiety. 12/26/18   Lorelee New, PA-C  ibuprofen (ADVIL,MOTRIN) 800 MG tablet Take 1 tablet (800 mg total) by mouth every 8 (eight) hours as needed. 01/31/18   Pricilla Loveless, MD  metroNIDAZOLE (METROGEL VAGINAL) 0.75 % vaginal gel Place 1 Applicatorful vaginally 2 (two) times daily. 12/13/18   Autry-Lott, Randa Evens, DO    Family History Family History  Problem Relation Age of Onset  . Hypertension Mother   . Diabetes Maternal Aunt   . Hypertension Maternal Aunt   . Thyroid disease Maternal Aunt   . Hypertension Maternal Uncle   . Diabetes Maternal Grandmother   . Diabetes Maternal Grandfather     Social History Social History   Tobacco Use  . Smoking status: Current Some Day Smoker    Packs/day: 0.25    Types: Cigarettes, Cigars  .  Smokeless tobacco: Never Used  Substance Use Topics  . Alcohol use: Yes    Comment: social  . Drug use: No    Comment: pt denies     Allergies   Penicillins, Sulfa antibiotics, and Sulfonamide derivatives   Review of Systems Review of Systems  All other systems reviewed and are negative.    Physical Exam Updated Vital Signs BP 119/69 (BP Location: Right Arm)   Pulse 89   Temp 98.8 F (37.1 C) (Oral)   Resp 18   Ht 5\' 5"  (1.651 m)   Wt 81.6 kg   LMP 12/23/2018   SpO2 95%   BMI 29.95 kg/m   Physical Exam Vitals signs and nursing note reviewed. Exam conducted with a chaperone present.  Constitutional:      Appearance: Normal appearance.  HENT:     Head: Normocephalic and atraumatic.     Mouth/Throat:     Pharynx: Oropharynx is clear.  Eyes:     General: No scleral  icterus.    Conjunctiva/sclera: Conjunctivae normal.  Cardiovascular:     Rate and Rhythm: Normal rate and regular rhythm.     Pulses: Normal pulses.  Pulmonary:     Effort: Pulmonary effort is normal. No respiratory distress.     Breath sounds: Normal breath sounds. No wheezing or rales.  Abdominal:     General: Abdomen is flat. There is no distension.     Palpations: Abdomen is soft.     Tenderness: There is no abdominal tenderness. There is no guarding.  Musculoskeletal:     Right lower leg: No edema.     Left lower leg: No edema.  Skin:    General: Skin is dry.  Neurological:     Mental Status: She is alert.     GCS: GCS eye subscore is 4. GCS verbal subscore is 5. GCS motor subscore is 6.  Psychiatric:        Mood and Affect: Mood normal.        Behavior: Behavior normal.        Thought Content: Thought content normal.      ED Treatments / Results  Labs (all labs ordered are listed, but only abnormal results are displayed) Labs Reviewed  I-STAT BETA HCG BLOOD, ED ([facility], [facility], [facility] ONLY)    EKG None  Radiology Dg Chest Portable 1 View  Result Date: 12/26/2018 CLINICAL DATA:  COVID positive EXAM: PORTABLE CHEST 1 VIEW COMPARISON:  10/31/2014 FINDINGS: The heart size and mediastinal contours are within normal limits. Both lungs are clear. The visualized skeletal structures are unremarkable. IMPRESSION: No acute abnormality of the lungs in AP portable projection. Electronically Signed   By: Eddie Candle M.D.   On: 12/26/2018 19:03    Procedures Procedures (including critical care time)  Medications Ordered in ED Medications - No data to display   Initial Impression / Assessment and Plan / ED Course  I have reviewed the triage vital signs and the nursing notes.  Pertinent labs & imaging results that were available during my care of the patient were reviewed by me and considered in my medical decision making (see chart for details).       Her EKG obtained today  demonstrates no significant changes from prior tracing and she is in normal sinus rhythm.  She reports that she has been having intermittent chest discomfort for the course of the past 7days with episodes lasting no more than 30 seconds.  She suspects that it is like related  to anxiety.  DG chest obtained demonstrates no acute cardiopulmonary findings or evidence of a spontaneous pneumothorax.  Her chest pain is nonexertional and not associated with inspiration.  No retractions.  No family history of early cardiac demise.  She is followed by her primary care provider, but she is requesting a short course of antianxiety medication.  Will prescribe hydroxyzine, will request that she follow-up with her PCP over the phone for ongoing evaluation management of her anxiety.  Her physical exam was reassuring and she was in no acute distress.  She is neither tachycardic, tachypneic, or hypoxic and she is PERC negative.  Discussed strict return precautions.  Patient is understanding and agreeable to the plan.   Jill Dennis was evaluated in Emergency Department on 12/26/2018 for the symptoms described in the history of present illness. She was evaluated in the context of the global COVID-19 pandemic, which necessitated consideration that the patient might be at risk for infection with the SARS-CoV-2 virus that causes COVID-19. Institutional protocols and algorithms that pertain to the evaluation of patients at risk for COVID-19 are in a state of rapid change based on information released by regulatory bodies including the CDC and federal and state organizations. These policies and algorithms were followed during the patient's care in the ED.    Final Clinical Impressions(s) / ED Diagnoses   Final diagnoses:  Chest pain, unspecified type    ED Discharge Orders         Ordered    hydrOXYzine (ATARAX/VISTARIL) 50 MG tablet  Every 6 hours PRN     12/26/18 1921           Elvera MariaGreen, Dahir Ayer L, PA-C 12/26/18  Mliss [id number]    Bero, Michael M, MD 12/27/18 2257

## 2018-12-26 NOTE — ED Triage Notes (Signed)
Pt states she tested positive for COVID this morning.  Reports loss of smell, loss of taste, generalized body aches, and pain to center of chest x 1 week.

## 2018-12-26 NOTE — ED Notes (Signed)
Patient verbalizes understanding of discharge instructions. Opportunity for questioning and answers were provided. Armband removed by staff, pt discharged from ED.  

## 2018-12-26 NOTE — Discharge Instructions (Signed)
°If you live with, or provide care at home for, a person confirmed to have, or being evaluated for, COVID-19 infection °please follow these guidelines to prevent infection: ° °Follow healthcare provider’s instructions °Make sure that you understand and can help the patient follow any healthcare provider instructions for all care. ° °Provide for the patient’s basic needs °You should help the patient with basic needs in the home and provide support for getting groceries, prescriptions, and °other personal needs. ° °Monitor the patient’s symptoms °If they are getting sicker, call his or her medical provider a ° This will help the healthcare provider’s office take steps to keep other people from getting infected. °Ask the healthcare provider to call the local or state health department. ° °Limit the number of people who have contact with the patient °If possible, have only one caregiver for the patient. °Other household members should stay in another home or place of residence. If this is not possible, they should stay °in another room, or be separated from the patient as much as possible. Use a separate bathroom, if available. °Restrict visitors who do not have an essential need to be in the home. ° °Keep older adults, very young children, and other sick people away from the patient °Keep older adults, very young children, and those who have compromised immune systems or chronic health conditions away from the patient. This includes people with chronic heart, lung, or kidney conditions, diabetes, and cancer. ° °Ensure good ventilation °Make sure that shared spaces in the home have good air flow, such as from an air conditioner or an opened window, °weather permitting. ° °Wash your hands often °Wash your hands often and thoroughly with soap and water for at least 20 seconds. You can use an alcohol based hand sanitizer if soap and water are not available and if your hands are not visibly dirty. °Avoid touching your  eyes, nose, and mouth with unwashed hands. °Use disposable paper towels to dry your hands. If not available, use dedicated cloth towels and replace them when they become wet. ° °Wear a facemask and gloves °Wear a disposable facemask at all times in the room and gloves when you touch or have contact with the patient’s blood, body fluids, and/or secretions or excretions, such as sweat, saliva, sputum, nasal mucus, vomit, urine, or feces.  Ensure the mask fits over your nose and mouth tightly, and do not touch it during use. °Throw out disposable facemasks and gloves after using them. Do not reuse. °Wash your hands immediately after removing your facemask and gloves. °If your personal clothing becomes contaminated, carefully remove clothing and launder. Wash your hands after handling contaminated clothing. °Place all used disposable facemasks, gloves, and other waste in a lined container before disposing them with other household waste. °Remove gloves and wash your hands immediately after handling these items. ° °Do not share dishes, glasses, or other household items with the patient °Avoid sharing household items. You should not share dishes, drinking glasses, cups, eating utensils, towels, bedding, or other items °After the person uses these items, you should wash them thoroughly with soap and water. ° °Wash laundry thoroughly °Immediately remove and wash clothes or bedding that have blood, body fluids, and/or secretions or excretions, such as sweat, saliva, sputum, nasal mucus, vomit, urine, or feces, on them. °Wear gloves when handling laundry from the patient. °Read and follow directions on labels of laundry or clothing items and detergent. In general, wash and dry with the warmest temperatures recommended on the   label. ° °Clean all areas the individual has used often °Clean all touchable surfaces, such as counters, tabletops, doorknobs, bathroom fixtures, toilets, phones, keyboards, tablets, and bedside tables,  every day. Also, clean any surfaces that may have blood, body fluids, and/or secretions or excretions on them. °Wear gloves when cleaning surfaces the patient has come in contact with. °Use a diluted bleach solution (e.g., dilute bleach with 1 part bleach and 10 parts water) or a household disinfectant with a label that says EPA-registered for coronaviruses. To make a bleach solution at home, add 1 tablespoon of bleach to 1 quart (4 cups) of water. For a larger supply, add ¼ cup of bleach to 1 gallon (16 cups) of water. °Read labels of cleaning products and follow recommendations provided on product labels. Labels contain instructions for safe and effective use of the cleaning product including precautions you should take when applying the product, such as wearing gloves or eye protection and making sure you have good ventilation during use of the product. °Remove gloves and wash hands immediately after cleaning. ° °Monitor yourself for signs and symptoms of illness °Caregivers and household members are considered close contacts, should monitor their health, and will be asked to limit °movement outside of the home to the extent possible. Follow the monitoring steps for close contacts listed on the °symptom monitoring form. ° ° °? If you have additional questions, contact your local health department or call the epidemiologist on call at [phone] °(available 24/7). °? This guidance is subject to change. For the most up-to-date guidance from CDC, please refer to their website: °https://www.cdc.gov/coronavirus/2019-ncov/hcp/guidance-prevent-spread.html  ° °

## 2019-01-03 ENCOUNTER — Other Ambulatory Visit: Payer: Self-pay

## 2019-01-03 DIAGNOSIS — Z20822 Contact with and (suspected) exposure to covid-19: Secondary | ICD-10-CM

## 2019-01-03 DIAGNOSIS — Z20828 Contact with and (suspected) exposure to other viral communicable diseases: Secondary | ICD-10-CM | POA: Diagnosis not present

## 2019-01-04 DIAGNOSIS — H5213 Myopia, bilateral: Secondary | ICD-10-CM | POA: Diagnosis not present

## 2019-01-04 DIAGNOSIS — H52223 Regular astigmatism, bilateral: Secondary | ICD-10-CM | POA: Diagnosis not present

## 2019-01-05 LAB — NOVEL CORONAVIRUS, NAA: SARS-CoV-2, NAA: NOT DETECTED

## 2019-02-23 DIAGNOSIS — H5213 Myopia, bilateral: Secondary | ICD-10-CM | POA: Diagnosis not present

## 2019-05-06 ENCOUNTER — Encounter: Payer: Self-pay | Admitting: Family Medicine

## 2019-05-06 ENCOUNTER — Ambulatory Visit (INDEPENDENT_AMBULATORY_CARE_PROVIDER_SITE_OTHER): Payer: Medicaid Other | Admitting: Family Medicine

## 2019-05-06 ENCOUNTER — Other Ambulatory Visit: Payer: Self-pay

## 2019-05-06 DIAGNOSIS — R609 Edema, unspecified: Secondary | ICD-10-CM | POA: Diagnosis not present

## 2019-05-06 DIAGNOSIS — G43009 Migraine without aura, not intractable, without status migrainosus: Secondary | ICD-10-CM

## 2019-05-06 MED ORDER — SUMATRIPTAN SUCCINATE 100 MG PO TABS: 100.0000 mg | ORAL_TABLET | ORAL | 0 refills | Status: DC | PRN

## 2019-05-06 NOTE — Progress Notes (Signed)
    SUBJECTIVE:   CHIEF COMPLAINT / HPI:   Migraines Patient presenting for migraine follow-up.  Has a history of migraines which she presented for in 2018.  At that time she was supposed to get an MRI but was scared of the MRI machine so she left her appointment.  She states that she periodically gets these migraines and has recently been getting them twice a week.  Reports a pressure feeling.  Also reports photophobia associated.  Reports when these headaches occur she has to sit in a dark area and sleep and is unable to do anything else.  Reports it is pulsatile.  Denies any vomiting but does have nausea.  Denies any vision changes.  Denies any aura symptoms.  Has been taking ibuprofen but this does not help.  Denies any head trauma.  LE swelling  Patient presenting for bilateral lower extremity swelling.  States that she first started noticing it when she went on a trip to [place] 2 weeks ago but thinks it has been occurring for longer than that.  Only occurs when she stands for long peers of time, at least over 10 minutes.  Mom has "fat feet and ankles" but no other family members with edema.  Denies any chest pain or shortness of breath.  Denies any other systemic symptoms.  PERTINENT  PMH / PSH: none  OBJECTIVE:   BP 100/72   Pulse 93   Ht 5\' 5"  (1.651 m)   Wt 193 lb 9.6 oz (87.8 kg)   LMP 04/14/2019 (Approximate)   SpO2 98%   BMI 32.22 kg/m Gen: awake and alert, NAD HEENT: PERRL, EOMI, no papilledema  Neuro: CN2-12 intact, no focal deficits, sensation intact, normal grip strength, 5/5 muscle strength, normal gait, no finger to nose dysmetria, normal heel to shin  Ext: no edema Foot: 2+ DP and PT pulses bilaterally  ASSESSMENT/PLAN:   Migraine Patient presenting with migraine headaches.  Will not need imaging at this time as this seems to be a longstanding issue.  Also neuro exam is within normal limits.  We will plan to treat with sumatriptan.  Advised to follow-up in 2 weeks  so we can ensure improvement.  Strict return precautions given.  ED precautions discussed.  Edema Likely secondary to prolonged standing.  Normal pulses and no edema on physical exam.  Advised to use compression stockings when standing for long periods of time as well as raising legs when seated.  DVT precautions discussed.  Follow-up if no improvement.  At that time can obtain baseline labs.  Strict return precautions given.     06/14/2019, DO [facility]

## 2019-05-06 NOTE — Patient Instructions (Signed)
Migraine Headache A migraine headache is a very strong throbbing pain on one side or both sides of your head. This type of headache can also cause other symptoms. It can last from 4 hours to 3 days. Talk with your doctor about what things may bring on (trigger) this condition. What are the causes? The exact cause of this condition is not known. This condition may be triggered or caused by:  Drinking alcohol.  Smoking.  Taking medicines, such as: ? Medicine used to treat chest pain (nitroglycerin). ? Birth control pills. ? Estrogen. ? Some blood pressure medicines.  Eating or drinking certain products.  Doing physical activity. Other things that may trigger a migraine headache include:  Having a menstrual period.  Pregnancy.  Hunger.  Stress.  Not getting enough sleep or getting too much sleep.  Weather changes.  Tiredness (fatigue). What increases the risk?  Being 25-55 years old.  Being female.  Having a family history of migraine headaches.  Being Caucasian.  Having depression or anxiety.  Being very overweight. What are the signs or symptoms?  A throbbing pain. This pain may: ? Happen in any area of the head, such as on one side or both sides. ? Make it hard to do daily activities. ? Get worse with physical activity. ? Get worse around bright lights or loud noises.  Other symptoms may include: ? Feeling sick to your stomach (nauseous). ? Vomiting. ? Dizziness. ? Being sensitive to bright lights, loud noises, or smells.  Before you get a migraine headache, you may get warning signs (an aura). An aura may include: ? Seeing flashing lights or having blind spots. ? Seeing bright spots, halos, or zigzag lines. ? Having tunnel vision or blurred vision. ? Having numbness or a tingling feeling. ? Having trouble talking. ? Having weak muscles.  Some people have symptoms after a migraine headache (postdromal phase), such as: ? Tiredness. ? Trouble  thinking (concentrating). How is this treated?  Taking medicines that: ? Relieve pain. ? Relieve the feeling of being sick to your stomach. ? Prevent migraine headaches.  Treatment may also include: ? Having acupuncture. ? Avoiding foods that bring on migraine headaches. ? Learning ways to control your body functions (biofeedback). ? Therapy to help you know and deal with negative thoughts (cognitive behavioral therapy). Follow these instructions at home: Medicines  Take over-the-counter and prescription medicines only as told by your doctor.  Ask your doctor if the medicine prescribed to you: ? Requires you to avoid driving or using heavy machinery. ? Can cause trouble pooping (constipation). You may need to take these steps to prevent or treat trouble pooping:  Drink enough fluid to keep your pee (urine) pale yellow.  Take over-the-counter or prescription medicines.  Eat foods that are high in fiber. These include beans, whole grains, and fresh fruits and vegetables.  Limit foods that are high in fat and sugar. These include fried or sweet foods. Lifestyle  Do not drink alcohol.  Do not use any products that contain nicotine or tobacco, such as cigarettes, e-cigarettes, and chewing tobacco. If you need help quitting, ask your doctor.  Get at least 8 hours of sleep every night.  Limit and deal with stress. General instructions      Keep a journal to find out what may bring on your migraine headaches. For example, write down: ? What you eat and drink. ? How much sleep you get. ? Any change in what you eat or drink. ? Any   change in your medicines.  If you have a migraine headache: ? Avoid things that make your symptoms worse, such as bright lights. ? It may help to lie down in a dark, quiet room. ? Do not drive or use heavy machinery. ? Ask your doctor what activities are safe for you.  Keep all follow-up visits as told by your doctor. This is important. Contact  a doctor if:  You get a migraine headache that is different or worse than others you have had.  You have more than 15 headache days in one month. Get help right away if:  Your migraine headache gets very bad.  Your migraine headache lasts longer than 72 hours.  You have a fever.  You have a stiff neck.  You have trouble seeing.  Your muscles feel weak or like you cannot control them.  You start to lose your balance a lot.  You start to have trouble walking.  You pass out (faint).  You have a seizure. Summary  A migraine headache is a very strong throbbing pain on one side or both sides of your head. These headaches can also cause other symptoms.  This condition may be treated with medicines and changes to your lifestyle.  Keep a journal to find out what may bring on your migraine headaches.  Contact a doctor if you get a migraine headache that is different or worse than others you have had.  Contact your doctor if you have more than 15 headache days in a month. This information is not intended to replace advice given to you by your health care provider. Make sure you discuss any questions you have with your health care provider. Document Revised: 05/21/2018 Document Reviewed: 03/11/2018 Elsevier Patient Education  2020 [employer].  For your migraines 1. I have prescribed sumatriptan. Take this right at the beginning of your headache. You may take up to 2 tablets per 24hrs 2. Follow up in 2 weeks.  3. If you have the worst headache of your life, vision changes, or weakness go to the ER  For your swelling 1. Use compression stockings. They can be found at [employer], any drug store, or [employer] 2. Keep your legs elevated when sitting 3. If only one leg is elevated follow up ASAP as that is a sign of a blood clot 4. Follow up in 1 month to make sure the compression stockings are helping

## 2019-05-08 DIAGNOSIS — G43909 Migraine, unspecified, not intractable, without status migrainosus: Secondary | ICD-10-CM | POA: Insufficient documentation

## 2019-05-08 DIAGNOSIS — R609 Edema, unspecified: Secondary | ICD-10-CM | POA: Insufficient documentation

## 2019-05-08 NOTE — Assessment & Plan Note (Signed)
Likely secondary to prolonged standing.  Normal pulses and no edema on physical exam.  Advised to use compression stockings when standing for long periods of time as well as raising legs when seated.  DVT precautions discussed.  Follow-up if no improvement.  At that time can obtain baseline labs.  Strict return precautions given.

## 2019-05-08 NOTE — Assessment & Plan Note (Signed)
Patient presenting with migraine headaches.  Will not need imaging at this time as this seems to be a longstanding issue.  Also neuro exam is within normal limits.  We will plan to treat with sumatriptan.  Advised to follow-up in 2 weeks so we can ensure improvement.  Strict return precautions given.  ED precautions discussed.

## 2020-09-17 ENCOUNTER — Ambulatory Visit
Admission: EM | Admit: 2020-09-17 | Discharge: 2020-09-17 | Disposition: A | Discharge status: Home / Self Care | Payer: Medicaid Other | Attending: Internal Medicine | Admitting: Internal Medicine

## 2020-09-17 ENCOUNTER — Encounter: Payer: Self-pay | Admitting: Emergency Medicine

## 2020-09-17 ENCOUNTER — Other Ambulatory Visit: Payer: Self-pay

## 2020-09-17 DIAGNOSIS — R059 Cough, unspecified: Secondary | ICD-10-CM | POA: Diagnosis not present

## 2020-09-17 DIAGNOSIS — J069 Acute upper respiratory infection, unspecified: Secondary | ICD-10-CM | POA: Insufficient documentation

## 2020-09-17 DIAGNOSIS — H6503 Acute serous otitis media, bilateral: Secondary | ICD-10-CM | POA: Diagnosis not present

## 2020-09-17 DIAGNOSIS — J029 Acute pharyngitis, unspecified: Secondary | ICD-10-CM | POA: Insufficient documentation

## 2020-09-17 DIAGNOSIS — Z20828 Contact with and (suspected) exposure to other viral communicable diseases: Secondary | ICD-10-CM | POA: Diagnosis not present

## 2020-09-17 LAB — POCT RAPID STREP A (OFFICE): Rapid Strep A Screen: NEGATIVE

## 2020-09-17 MED ORDER — CETIRIZINE HCL 10 MG PO TABS: 10.0000 mg | ORAL_TABLET | Freq: Every day | ORAL | 0 refills | Status: DC

## 2020-09-17 MED ORDER — PREDNISONE 20 MG PO TABS: 40.0000 mg | ORAL_TABLET | Freq: Every day | ORAL | 0 refills | Status: AC

## 2020-09-17 MED ORDER — FLUTICASONE PROPIONATE 50 MCG/ACT NA SUSP: 1.0000 | Freq: Every day | NASAL | 0 refills | Status: DC

## 2020-09-17 NOTE — ED Triage Notes (Signed)
Pt here for possible allergic reaction with intermittent ear and throat itchiness x 2 weeks that was worse last night; no distress noted at present

## 2020-09-17 NOTE — Discharge Instructions (Addendum)
You are being treated for acute viral upper respiratory infection with prednisone steroid to decrease inflammation.  You are being treated with cetirizine and Flonase to help with fluid in the ears.  Please do not take Flonase more than 3 days.  Please do not take hydroxyzine along with cetirizine as this may cause drowsiness.  Your rapid strep test was negative in urgent care today.  Throat culture and COVID-19 viral swab are pending.

## 2020-09-17 NOTE — ED Provider Notes (Signed)
[name]    CSN: [id number] Arrival date & time: 09/17/20  1715      History   Chief Complaint Chief Complaint  Patient presents with  . Allergic Reaction    HPI Jill Dennis is a 25 y.o. female.   Patient presents to urgent care because she thinks that she has been having an allergic reaction for 2 weeks.  Patient states that she has had an itchy throat, cough, ear discomfort.  Denies any recent changes to foods, lotions, detergents, soaps, etc.  Patient is not sure what she would have had an allergic reaction to.  Denies any current shortness of breath but states that she has had intermittent shortness of breath.  Denies any chronic lung diseases or any history of allergic reaction.  Denies any chest pain.  Denies any known fevers or sick contacts.   Allergic Reaction  Past Medical History:  Diagnosis Date  . Anemia 2013  . Heart murmur     Patient Active Problem List   Diagnosis Date Noted  . Migraine 05/08/2019  . Edema 05/08/2019  . Ingrown hair 12/13/2018  . Screen for STD (sexually transmitted disease) 03/25/2018  . BV (bacterial vaginosis) 03/25/2018  . Headache 10/30/2016  . Acute upper respiratory infection 09/26/2016  . Right shoulder pain 08/18/2014  . Anemia 01/27/2012  . Abnormal genetic test in pregnancy 11/04/2011  . Vaginal discharge 04/23/2011  . Acne 05/09/2010    Past Surgical History:  Procedure Laterality Date  . NO PAST SURGERIES      OB History     Gravida  1   Para  1   Term  1   Preterm  0   AB  0   Living  1      SAB  0   IAB  0   Ectopic  0   Multiple  0   Live Births  1            Home Medications    Prior to Admission medications   Medication Sig Start Date End Date Taking? Authorizing Provider  cetirizine (ZYRTEC) 10 MG tablet Take 1 tablet (10 mg total) by mouth daily. 09/17/20  Yes Lance Muss, FNP  fluticasone (FLONASE) 50 MCG/ACT nasal spray Place 1 spray into both nostrils  daily. 09/17/20  Yes Lance Muss, FNP  predniSONE (DELTASONE) 20 MG tablet Take 2 tablets (40 mg total) by mouth daily for 5 days. 09/17/20 09/22/20 Yes Lance Muss, FNP  cephALEXin (KEFLEX) 500 MG capsule Take 1 capsule (500 mg total) by mouth 2 (two) times daily. Patient not taking: Reported on 09/17/2020 03/28/18   Eustace Moore, MD  hydrOXYzine (ATARAX/VISTARIL) 50 MG tablet Take 1 tablet (50 mg total) by mouth every 6 (six) hours as needed for anxiety. 12/26/18   Lorelee New, PA-C  ibuprofen (ADVIL,MOTRIN) 800 MG tablet Take 1 tablet (800 mg total) by mouth every 8 (eight) hours as needed. 01/31/18   Pricilla Loveless, MD  metroNIDAZOLE (METROGEL VAGINAL) 0.75 % vaginal gel Place 1 Applicatorful vaginally 2 (two) times daily. Patient not taking: Reported on 09/17/2020 12/13/18   Autry-Lott, Randa Evens, DO  SUMAtriptan (IMITREX) 100 MG tablet Take 1 tablet (100 mg total) by mouth every 2 (two) hours as needed for migraine. May repeat in 2 hours if headache persists or recurs. Do not take more than 2 tablets in 24hrs 05/06/19   Oralia Manis, DO    Family History Family History  Problem Relation  Age of Onset  . Hypertension Mother   . Diabetes Maternal Aunt   . Hypertension Maternal Aunt   . Thyroid disease Maternal Aunt   . Hypertension Maternal Uncle   . Diabetes Maternal Grandmother   . Diabetes Maternal Grandfather     Social History Social History   Tobacco Use  . Smoking status: Some Days    Packs/day: 0.25    Types: Cigarettes, Cigars  . Smokeless tobacco: Never  Vaping Use  . Vaping Use: Never used  Substance Use Topics  . Alcohol use: Yes    Comment: social  . Drug use: No    Comment: pt denies     Allergies   Penicillins, Sulfa antibiotics, and Sulfonamide derivatives   Review of Systems Review of Systems Per HPI  Physical Exam Triage Vital Signs ED Triage Vitals  Enc Vitals Group     BP 09/17/20 1735 125/87     Pulse Rate 09/17/20 1735 80      Resp 09/17/20 1735 18     Temp 09/17/20 1735 98.1 F (36.7 C)     Temp Source 09/17/20 1735 Oral     SpO2 09/17/20 1735 97 %     Weight --      Height --      Head Circumference --      Peak Flow --      Pain Score 09/17/20 1736 0     Pain Loc --      Pain Edu? --      Excl. in GC? --    No data found.  Updated Vital Signs BP 125/87 (BP Location: Left Arm)   Pulse 80   Temp 98.1 F (36.7 C) (Oral)   Resp 18   SpO2 97%   Visual Acuity Right Eye Distance:   Left Eye Distance:   Bilateral Distance:    Right Eye Near:   Left Eye Near:    Bilateral Near:     Physical Exam Constitutional:      General: She is not in acute distress.    Appearance: Normal appearance.  HENT:     Head: Normocephalic and atraumatic.     Right Ear: Ear canal normal. A middle ear effusion is present. Tympanic membrane is not erythematous or bulging.     Left Ear: Ear canal normal. A middle ear effusion is present. Tympanic membrane is not erythematous or bulging.     Nose: Mucosal edema and congestion present.     Mouth/Throat:     Mouth: Mucous membranes are moist.     Pharynx: Posterior oropharyngeal erythema present.  Eyes:     Extraocular Movements: Extraocular movements intact.     Conjunctiva/sclera: Conjunctivae normal.     Pupils: Pupils are equal, round, and reactive to light.  Cardiovascular:     Rate and Rhythm: Normal rate and regular rhythm.     Pulses: Normal pulses.     Heart sounds: Normal heart sounds.  Pulmonary:     Effort: Pulmonary effort is normal. No respiratory distress.     Breath sounds: Normal breath sounds. No wheezing.  Abdominal:     General: Abdomen is flat. Bowel sounds are normal.     Palpations: Abdomen is soft.  Musculoskeletal:        General: Normal range of motion.     Cervical back: Normal range of motion.  Skin:    General: Skin is warm and dry.  Neurological:     General: No focal deficit present.  Mental Status: She is alert and  oriented to person, place, and time. Mental status is at baseline.  Psychiatric:        Mood and Affect: Mood normal.        Behavior: Behavior normal.     UC Treatments / Results  Labs (all labs ordered are listed, but only abnormal results are displayed) Labs Reviewed  CULTURE, GROUP A STREP ([facility])  NOVEL CORONAVIRUS, NAA  POCT RAPID STREP A (OFFICE)    EKG   Radiology No results found.  Procedures Procedures (including critical care time)  Medications Ordered in UC Medications - No data to display  Initial Impression / Assessment and Plan / UC Course  I have reviewed the triage vital signs and the nursing notes.  Pertinent labs & imaging results that were available during my care of the patient were reviewed by me and considered in my medical decision making (see chart for details).     Patient presents with symptoms likely from a viral upper respiratory infection. Differential includes bacterial pneumonia, sinusitis, allergic rhinitis, Covid 19. Do not suspect underlying cardiopulmonary process. Symptoms seem unlikely related to ACS, CHF or COPD exacerbations, pneumonia, pneumothorax. Patient is nontoxic appearing and not in need of emergent medical intervention.  Recommended symptom control with over the counter medications: Daily oral anti-histamine, Oral decongestant or IN corticosteroid, saline irrigations, cepacol lozenges, Robitussin, Delsym, honey tea.  Symptoms are more consistent with viral upper respiratory infection as opposed to allergic reaction.  Will prescribe prednisone steroid taper that will help treat allergic reaction and viral upper respiratory infection.  Also prescribed cetirizine antihistamine and Flonase nasal spray to help with bilateral fluid in ears.  Rapid strep test was negative in urgent care today.  Throat culture and COVID-19 viral swab are pending.  Return if symptoms fail to improve in 1-2 weeks or you develop shortness of breath, chest  pain, severe headache. Patient states understanding and is agreeable.  Discharged with PCP or urgent care followup.  Final Clinical Impressions(s) / UC Diagnoses   Final diagnoses:  Viral upper respiratory infection  Sore throat  Cough  Non-recurrent acute serous otitis media of both ears     Discharge Instructions      You are being treated for acute viral upper respiratory infection with prednisone steroid to decrease inflammation.  You are being treated with cetirizine and Flonase to help with fluid in the ears.  Please do not take Flonase more than 3 days.  Please do not take hydroxyzine along with cetirizine as this may cause drowsiness.  Your rapid strep test was negative in urgent care today.  Throat culture and COVID-19 viral swab are pending.     ED Prescriptions     Medication Sig Dispense Auth. Provider   predniSONE (DELTASONE) 20 MG tablet Take 2 tablets (40 mg total) by mouth daily for 5 days. 10 tablet Lance Muss, FNP   cetirizine (ZYRTEC) 10 MG tablet Take 1 tablet (10 mg total) by mouth daily. 30 tablet Lance Muss, FNP   fluticasone (FLONASE) 50 MCG/ACT nasal spray Place 1 spray into both nostrils daily. 16 g Lance Muss, FNP      PDMP not reviewed this encounter.   Lance Muss, FNP 09/17/20 1825

## 2020-09-18 LAB — SARS-COV-2, NAA 2 DAY TAT

## 2020-09-18 LAB — NOVEL CORONAVIRUS, NAA: SARS-CoV-2, NAA: NOT DETECTED

## 2020-09-20 LAB — CULTURE, GROUP A STREP (THRC)

## 2020-10-29 ENCOUNTER — Ambulatory Visit
Admission: EM | Admit: 2020-10-29 | Discharge: 2020-10-29 | Disposition: A | Discharge status: Home / Self Care | Payer: Medicaid Other | Attending: Internal Medicine | Admitting: Internal Medicine

## 2020-10-29 ENCOUNTER — Other Ambulatory Visit: Payer: Self-pay

## 2020-10-29 DIAGNOSIS — H65193 Other acute nonsuppurative otitis media, bilateral: Secondary | ICD-10-CM

## 2020-10-29 DIAGNOSIS — J069 Acute upper respiratory infection, unspecified: Secondary | ICD-10-CM | POA: Diagnosis not present

## 2020-10-29 MED ORDER — CETIRIZINE HCL 10 MG PO TABS: 10.0000 mg | ORAL_TABLET | Freq: Every day | ORAL | 0 refills | Status: DC

## 2020-10-29 MED ORDER — PROMETHAZINE-DM 6.25-15 MG/5ML PO SYRP: 5.0000 mL | ORAL_SOLUTION | Freq: Four times a day (QID) | ORAL | 0 refills | Status: DC | PRN

## 2020-10-29 MED ORDER — FLUTICASONE PROPIONATE 50 MCG/ACT NA SUSP: 1.0000 | Freq: Every day | NASAL | 0 refills | Status: DC

## 2020-10-29 NOTE — Discharge Instructions (Signed)
You likely having a viral upper respiratory infection. We recommended symptom control. I expect your symptoms to start improving in the next 1-2 weeks.   1. Take a daily allergy pill/anti-histamine like Zyrtec, Claritin, or Store brand consistently for 2 weeks. You have been prescribed this medication.   2. You have been prescribed a cough medication.   3. For your sore throat you may try cepacol lozenges, salt water gargles, throat spray. Treatment of congestion may also help your sore throat.   4. Stay hydrated, drink plenty of fluids to keep throat coated and less irritated  Honey Tea For cough/sore throat try using a honey-based tea. Use 3 teaspoons of honey with juice squeezed from half lemon. Place shaved pieces of ginger into 1/2-1 cup of water and warm over stove top. Then mix the ingredients and repeat every 4 hours as needed.

## 2020-10-29 NOTE — ED Provider Notes (Addendum)
[name]    CSN: [id number] Arrival date & time: 10/29/20  1528      History   Chief Complaint Chief Complaint  Patient presents with   Cough    HPI Jill Dennis is a 25 y.o. female.   Patient presents with cough and nasal congestion that have been present for approximately 4 days.  Cough is productive at times with clear sputum.  Denies any known fevers or sick contacts.  Denies any chest pain or shortness of breath.  Patient has been taking Mucinex and DayQuil with minimal relief in symptoms.  Patient has taken 4 at home COVID-19 tests which were all negative.   Cough  Past Medical History:  Diagnosis Date   Anemia 2013   Heart murmur     Patient Active Problem List   Diagnosis Date Noted   Migraine 05/08/2019   Edema 05/08/2019   Ingrown hair 12/13/2018   Screen for STD (sexually transmitted disease) 03/25/2018   BV (bacterial vaginosis) 03/25/2018   Headache 10/30/2016   Acute upper respiratory infection 09/26/2016   Right shoulder pain 08/18/2014   Anemia 01/27/2012   Abnormal genetic test in pregnancy 11/04/2011   Vaginal discharge 04/23/2011   Acne 05/09/2010    Past Surgical History:  Procedure Laterality Date   NO PAST SURGERIES      OB History     Gravida  1   Para  1   Term  1   Preterm  0   AB  0   Living  1      SAB  0   IAB  0   Ectopic  0   Multiple  0   Live Births  1            Home Medications    Prior to Admission medications   Medication Sig Start Date End Date Taking? Authorizing Provider  cetirizine (ZYRTEC) 10 MG tablet Take 1 tablet (10 mg total) by mouth daily. 10/29/20  Yes Lance Muss, FNP  fluticasone (FLONASE) 50 MCG/ACT nasal spray Place 1 spray into both nostrils daily. Limit use to 3 days 10/29/20  Yes Lance Muss, FNP  promethazine-dextromethorphan (PROMETHAZINE-DM) 6.25-15 MG/5ML syrup Take 5 mLs by mouth 4 (four) times daily as needed for cough. 10/29/20  Yes Lance Muss, FNP    Family History Family History  Problem Relation Age of Onset   Hypertension Mother    Diabetes Maternal Aunt    Hypertension Maternal Aunt    Thyroid disease Maternal Aunt    Hypertension Maternal Uncle    Diabetes Maternal Grandmother    Diabetes Maternal Grandfather     Social History Social History   Tobacco Use   Smoking status: Some Days    Packs/day: 0.25    Types: Cigarettes, Cigars   Smokeless tobacco: Never  Vaping Use   Vaping Use: Never used  Substance Use Topics   Alcohol use: Yes    Comment: social   Drug use: No    Comment: pt denies     Allergies   Penicillins, Sulfa antibiotics, and Sulfonamide derivatives   Review of Systems Review of Systems Per HPI  Physical Exam Triage Vital Signs ED Triage Vitals [10/29/20 1536]  Enc Vitals Group     BP 112/73     Pulse Rate 88     Resp 18     Temp 99.3 F (37.4 C)     Temp Source Oral  SpO2 97 %     Weight      Height      Head Circumference      Peak Flow      Pain Score 0     Pain Loc      Pain Edu?      Excl. in GC?    No data found.  Updated Vital Signs BP 112/73 (BP Location: Left Arm)   Pulse 88   Temp 99.3 F (37.4 C) (Oral)   Resp 18   LMP 10/15/2020   SpO2 97%   Visual Acuity Right Eye Distance:   Left Eye Distance:   Bilateral Distance:    Right Eye Near:   Left Eye Near:    Bilateral Near:     Physical Exam Constitutional:      General: She is not in acute distress.    Appearance: Normal appearance. She is not toxic-appearing.  HENT:     Head: Normocephalic and atraumatic.     Right Ear: Ear canal normal. A middle ear effusion is present. Tympanic membrane is not erythematous or bulging.     Left Ear: Ear canal normal. A middle ear effusion is present. Tympanic membrane is not erythematous or bulging.     Nose: Congestion present.     Mouth/Throat:     Mouth: Mucous membranes are moist.     Pharynx: No posterior oropharyngeal erythema.   Eyes:     Extraocular Movements: Extraocular movements intact.     Conjunctiva/sclera: Conjunctivae normal.     Pupils: Pupils are equal, round, and reactive to light.  Cardiovascular:     Rate and Rhythm: Normal rate and regular rhythm.     Pulses: Normal pulses.     Heart sounds: Normal heart sounds.  Pulmonary:     Effort: Pulmonary effort is normal. No respiratory distress.     Breath sounds: Normal breath sounds. No wheezing or rhonchi.  Abdominal:     General: Abdomen is flat. Bowel sounds are normal.     Palpations: Abdomen is soft.  Musculoskeletal:        General: Normal range of motion.     Cervical back: Normal range of motion.  Skin:    General: Skin is warm and dry.  Neurological:     General: No focal deficit present.     Mental Status: She is alert and oriented to person, place, and time. Mental status is at baseline.  Psychiatric:        Mood and Affect: Mood normal.        Behavior: Behavior normal.     UC Treatments / Results  Labs (all labs ordered are listed, but only abnormal results are displayed) Labs Reviewed - No data to display  EKG   Radiology No results found.  Procedures Procedures (including critical care time)  Medications Ordered in UC Medications - No data to display  Initial Impression / Assessment and Plan / UC Course  I have reviewed the triage vital signs and the nursing notes.  Pertinent labs & imaging results that were available during my care of the patient were reviewed by me and considered in my medical decision making (see chart for details).     Patient presents with symptoms likely from a viral upper respiratory infection. Differential includes sinusitis, allergic rhinitis, Covid 19. Do not suspect underlying cardiopulmonary process. Symptoms seem unlikely related to ACS, CHF or COPD exacerbations, pneumonia, pneumothorax. Patient is nontoxic appearing and not in need of emergent  medical intervention.  Recommended  symptom control with over the counter medications.  Patient was prescribed cough medication to take as needed.  Also prescribed cetirizine and Flonase to help with bilateral middle ear effusion.  Do not think COVID-19 testing is necessary at this time as patient has had 4 at home negative COVID-19 tests.  Return if symptoms fail to improve in 1-2 weeks or you develop shortness of breath, chest pain, severe headache. Patient states understanding and is agreeable.  Discharged with PCP followup.  Final Clinical Impressions(s) / UC Diagnoses   Final diagnoses:  Viral upper respiratory tract infection with cough  Acute MEE (middle ear effusion), bilateral     Discharge Instructions      You likely having a viral upper respiratory infection. We recommended symptom control. I expect your symptoms to start improving in the next 1-2 weeks.   1. Take a daily allergy pill/anti-histamine like Zyrtec, Claritin, or Store brand consistently for 2 weeks. You have been prescribed this medication.   2. You have been prescribed a cough medication.   3. For your sore throat you may try cepacol lozenges, salt water gargles, throat spray. Treatment of congestion may also help your sore throat.   4. Stay hydrated, drink plenty of fluids to keep throat coated and less irritated  Honey Tea For cough/sore throat try using a honey-based tea. Use 3 teaspoons of honey with juice squeezed from half lemon. Place shaved pieces of ginger into 1/2-1 cup of water and warm over stove top. Then mix the ingredients and repeat every 4 hours as needed.      ED Prescriptions     Medication Sig Dispense Auth. Provider   cetirizine (ZYRTEC) 10 MG tablet Take 1 tablet (10 mg total) by mouth daily. 30 tablet Lance Muss, FNP   fluticasone (FLONASE) 50 MCG/ACT nasal spray Place 1 spray into both nostrils daily. Limit use to 3 days 16 g Lance Muss, FNP   promethazine-dextromethorphan (PROMETHAZINE-DM) 6.25-15 MG/5ML  syrup Take 5 mLs by mouth 4 (four) times daily as needed for cough. 118 mL Lance Muss, FNP      I have reviewed the PDMP during this encounter.   Lance Muss, FNP 10/29/20 1552    Lance Muss, FNP 10/29/20 1552

## 2020-10-29 NOTE — ED Triage Notes (Addendum)
Pt c/o cough and nasal congestion since Friday. States now having chest congestion. States has had 4 neg home covid test. Concerns of PNA.

## 2020-11-23 ENCOUNTER — Ambulatory Visit: Payer: Medicaid Other | Admitting: Family Medicine

## 2021-02-26 ENCOUNTER — Ambulatory Visit: Payer: Medicaid Other | Admitting: Family Medicine

## 2021-02-26 NOTE — Progress Notes (Deleted)
    SUBJECTIVE:   CHIEF COMPLAINT / HPI:   ***  PERTINENT  PMH / PSH: ***  OBJECTIVE:   There were no vitals taken for this visit.  ***  ASSESSMENT/PLAN:   No problem-specific Assessment & Plan notes found for this encounter.     Jeraline Marcinek Autry-Lott, DO [facility]   

## 2021-02-26 NOTE — Patient Instructions (Incomplete)
For weight management: Call Dr. Gerilyn Pilgrim (our nutritionist) to set up an appointment. Her phone number is: [phone].

## 2021-07-16 ENCOUNTER — Encounter: Payer: Self-pay | Admitting: *Deleted

## 2021-07-16 DIAGNOSIS — M25561 Pain in right knee: Secondary | ICD-10-CM | POA: Diagnosis not present

## 2021-07-16 DIAGNOSIS — M542 Cervicalgia: Secondary | ICD-10-CM | POA: Diagnosis not present

## 2021-07-16 DIAGNOSIS — R519 Headache, unspecified: Secondary | ICD-10-CM | POA: Diagnosis not present

## 2021-07-16 DIAGNOSIS — R102 Pelvic and perineal pain: Secondary | ICD-10-CM | POA: Diagnosis not present

## 2021-07-16 DIAGNOSIS — M545 Low back pain, unspecified: Secondary | ICD-10-CM | POA: Diagnosis not present

## 2021-07-16 DIAGNOSIS — G4489 Other headache syndrome: Secondary | ICD-10-CM | POA: Diagnosis not present

## 2021-07-17 ENCOUNTER — Telehealth: Payer: Self-pay

## 2021-07-17 DIAGNOSIS — M542 Cervicalgia: Secondary | ICD-10-CM | POA: Diagnosis not present

## 2021-07-17 NOTE — Telephone Encounter (Signed)
Transition Care Management Follow-up Telephone Call Date of discharge and from where: 07/16/2021 from [facility] How have you been since you were released from the hospital? Patient stated that her pain has worsened. Patient did not have any questions at this time. Patient will call PCP for follow up appt for further evaluation.  Any questions or concerns? No  Items Reviewed: Did the pt receive and understand the discharge instructions provided? Yes  Medications obtained and verified? Yes  Other? No  Any new allergies since your discharge? No  Dietary orders reviewed? No Do you have support at home? Yes   Functional Questionnaire: (I = Independent and D = Dependent) ADLs: I  Bathing/Dressing- I  Meal Prep- I  Eating- I  Maintaining continence- I  Transferring/Ambulation- I  Managing Meds- I   Follow up appointments reviewed:  PCP Hospital f/u appt confirmed? No  Patient is calling PCP for appt.  Specialist Hospital f/u appt confirmed? No   Are transportation arrangements needed? No  If their condition worsens, is the pt aware to call PCP or go to the Emergency Dept.? Yes Was the patient provided with contact information for the PCP's office or ED? Yes Was to pt encouraged to call back with questions or concerns? Yes

## 2021-07-19 ENCOUNTER — Encounter: Payer: Self-pay | Admitting: Family Medicine

## 2021-07-19 ENCOUNTER — Ambulatory Visit (INDEPENDENT_AMBULATORY_CARE_PROVIDER_SITE_OTHER): Payer: Medicaid Other | Admitting: Family Medicine

## 2021-07-19 ENCOUNTER — Telehealth: Payer: Self-pay | Admitting: Family Medicine

## 2021-07-19 DIAGNOSIS — M25561 Pain in right knee: Secondary | ICD-10-CM | POA: Diagnosis not present

## 2021-07-19 DIAGNOSIS — S060X0A Concussion without loss of consciousness, initial encounter: Secondary | ICD-10-CM | POA: Diagnosis not present

## 2021-07-19 MED ORDER — LIDOCAINE 4 % EX PTCH: 1.0000 | MEDICATED_PATCH | CUTANEOUS | 0 refills | Status: DC

## 2021-07-19 NOTE — Patient Instructions (Addendum)
It was wonderful to see you today.  Please bring ALL of your medications with you to every visit.   Today we talked about:  -Headache and neck pain should improve over the next several weeks.  You can continue to use naproxen with food.  It is possible that you have a concussion.  Follow-up in 1 month - Right knee pain will likely improve over the next several weeks.  Your previous x-ray was negative for fracture or dislocation.  You can use ice in NSAIDs to decrease pain and swelling.  Follow-up in 1 month.  Please be sure to schedule follow up at the front  desk before you leave today.   Please call the clinic at [phone] if your symptoms worsen or you have any concerns. It was our pleasure to serve you.  Dr. Salvadore Dom  Follow these instructions at home: Activity Rest. Avoid activities that are hard or tiring. Make sure you get enough sleep. Let your brain rest. Do this by limiting activities that need a lot of thought or attention, such as: Watching TV. Playing memory games and puzzles. Job-related work or homework. Working on [employer], and texting. Avoid activities that could cause another head injury until your doctor says it is okay. This includes playing sports. Having another head injury, especially before the first one has healed, can be dangerous. Ask your doctor when it is safe for you to go back to your normal activities, such as work or school. Ask your doctor for a step-by-step plan for slowly going back to your normal activities. Ask your doctor when you can drive, ride a bicycle, or use heavy machinery. Do not do these activities if you are dizzy. Lifestyle  Do not drink alcohol until your doctor says it is okay. Do not use drugs. If it is harder than usual to remember things, write them down. If you are easily distracted, try to do one thing at a time. Talk with family members or close friends when making important decisions. Tell your  friends, family, a trusted co-worker, and work Production designer, theatre/television/film about your injury, symptoms, and limits (restrictions). Have them watch for any problems that are new or getting worse. General instructions Take over-the-counter and prescription medicines only as told by your doctor. Have someone stay with you for 24 hours after your head injury. This person should watch you for any changes in your symptoms and be ready to get help. Keep all follow-up visits as told by your doctor. This is important. Get help right away if: You have: A very bad headache that is not helped by medicine. Trouble walking or weakness in your arms and legs. Clear or bloody fluid coming from your nose or ears. Changes in how you see (vision). A seizure. More confusion or more grumpy moods. Your symptoms get worse. You are sleepier than normal and have trouble staying awake. You lose your balance. The black centers of your eyes (pupils) change in size. Your speech is slurred. Your dizziness gets worse. You vomit. These symptoms may be an emergency. Do not wait to see if the symptoms will go away. Get medical help right away. Call your local emergency services (911 in the [place] Do not drive yourself to the hospital.  This information is not intended to replace advice given to you by your health care provider. Make sure you discuss any questions you have with your health care provider. Document Revised: 12/10/2018 Document Reviewed: 12/10/2018 Elsevier Patient Education  2023 [employer].
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
## 2021-07-19 NOTE — Telephone Encounter (Signed)
Pt is requesting Dr. Salvadore Dom give her a call; Unable to get an appt for July.

## 2021-07-24 NOTE — Telephone Encounter (Signed)
Called the patient.  Wanted to have follow-up visit with me but discussed that I will be leaving the practice in June and not necessarily available for direct primary care for the next year due to being a part of the [facility] fellowship.  She voiced understanding.  And I was appreciative of the call.  Gerlene Fee, DO 07/24/2021, 5:02 PM PGY-3, [place]

## 2021-08-14 NOTE — Progress Notes (Signed)
No show for appt. 

## 2021-08-15 ENCOUNTER — Ambulatory Visit (INDEPENDENT_AMBULATORY_CARE_PROVIDER_SITE_OTHER): Payer: Medicaid Other | Admitting: Family Medicine

## 2021-08-15 DIAGNOSIS — Z91199 Patient's noncompliance with other medical treatment and regimen due to unspecified reason: Secondary | ICD-10-CM

## 2021-08-17 DIAGNOSIS — Z91199 Patient's noncompliance with other medical treatment and regimen due to unspecified reason: Secondary | ICD-10-CM | POA: Insufficient documentation

## 2021-11-29 ENCOUNTER — Other Ambulatory Visit (HOSPITAL_COMMUNITY)
Admission: RE | Admit: 2021-11-29 | Discharge: 2021-11-29 | Disposition: A | Discharge status: Home / Self Care | Payer: Medicaid Other | Source: Ambulatory Visit | Attending: Family Medicine | Admitting: Family Medicine

## 2021-11-29 ENCOUNTER — Ambulatory Visit (INDEPENDENT_AMBULATORY_CARE_PROVIDER_SITE_OTHER): Payer: Medicaid Other | Admitting: Student

## 2021-11-29 VITALS — BP 140/75 | HR 98 | Ht 65.0 in | Wt 212.5 lb

## 2021-11-29 DIAGNOSIS — Z113 Encounter for screening for infections with a predominantly sexual mode of transmission: Secondary | ICD-10-CM | POA: Diagnosis not present

## 2021-11-29 DIAGNOSIS — N920 Excessive and frequent menstruation with regular cycle: Secondary | ICD-10-CM

## 2021-11-29 LAB — POCT WET PREP (WET MOUNT)
Clue Cells Wet Prep Whiff POC: POSITIVE
Trichomonas Wet Prep HPF POC: ABSENT

## 2021-11-29 MED ORDER — NORETHINDRONE 0.35 MG PO TABS: 1.0000 | ORAL_TABLET | Freq: Every day | ORAL | 2 refills | Status: DC

## 2021-11-29 NOTE — Progress Notes (Signed)
    SUBJECTIVE:   CHIEF COMPLAINT / HPI:   Vaginal Discharge: Patient is a 26 y.o. female presenting with desire for STI screening as well as Pap smear.  Currently sexually active with female partner. Her concern today is that she has very painful periods with excessive bleeding.  Periods last about 6 to 7 days and she goes through 6 pads a day.  She is concerned about fibroids, which she discussed with her mother.  She also says she has a history of chronic BV, she uses boric acid suppositories for this as needed at home.  PERTINENT  PMH / PSH: None relevant  OBJECTIVE:   BP (!) 140/75   Pulse 98   Ht 5\' 5"  (1.651 m)   Wt 212 lb 8 oz (96.4 kg)   LMP 11/23/2021   SpO2 99%   BMI 35.36 kg/m    General: NAD, pleasant, able to participate in exam Respiratory: Normal effort, no obvious respiratory distress Pelvic: VULVA: normal appearing vulva with no masses, tenderness or lesions, VAGINA: Normal appearing vagina with normal color, no lesions, with scant discharge present. CERVIX: No lesions, friable with exam.  Chaperone Tashira, CMA present for pelvic exam  ASSESSMENT/PLAN:   Heavy periods 26 year old female with history of anemia and heavy, painful periods. No abnormalities on bimanual exam appreciated. Likely dysfunctional uterine bleeding.  Consider PMDD, endometriosis, fibroids. Plan for symptom management with NSAIDs, heating pads, will start progestin only pills today to see if she has any improvement in symptoms with menstruation.  Combined OCP is contraindicated secondary to vaping/tobacco use. Follow-up in 6 weeks to see how she is doing.    Assessment:  26 y.o. female here for Pap smear and STI screening.  Exam largely unremarkable other than slight amount of friability on cervix with exam.  No discharge.   Plan: -Follow-up wet prep -GC/chlamydia pending -Will check HIV and RPR -Follow-up Pap  Orvis Brill, [place]

## 2021-11-29 NOTE — Patient Instructions (Signed)
So great seeing you today.  Your Pap results will come back within the next week.  I will call you if they are abnormal. I will also call if the rest results are abnormal.  For your painful periods, we will try birth control pills.  Take this every day. Please come back and see me in 6 weeks ago and see how you are doing.  Dr. Owens Shark

## 2021-11-29 NOTE — Assessment & Plan Note (Signed)
26 year old female with history of anemia and heavy, painful periods. No abnormalities on bimanual exam appreciated. Likely dysfunctional uterine bleeding.  Consider PMDD, endometriosis, fibroids. Plan for symptom management with NSAIDs, heating pads, will start progestin only pills today to see if she has any improvement in symptoms with menstruation.  Combined OCP is contraindicated secondary to vaping/tobacco use. Follow-up in 6 weeks to see how she is doing.

## 2021-11-30 LAB — HIV ANTIBODY (ROUTINE TESTING W REFLEX): HIV Screen 4th Generation wRfx: NONREACTIVE

## 2021-11-30 LAB — RPR: RPR Ser Ql: NONREACTIVE

## 2021-12-05 ENCOUNTER — Encounter: Payer: Self-pay | Admitting: Student

## 2021-12-05 LAB — CYTOLOGY - PAP: Diagnosis: NEGATIVE

## 2022-01-13 ENCOUNTER — Ambulatory Visit: Payer: Medicaid Other | Admitting: Student

## 2022-03-11 ENCOUNTER — Encounter: Payer: Self-pay | Admitting: Family Medicine

## 2022-03-11 ENCOUNTER — Ambulatory Visit (INDEPENDENT_AMBULATORY_CARE_PROVIDER_SITE_OTHER): Payer: Medicaid Other | Admitting: Family Medicine

## 2022-03-11 VITALS — BP 119/86 | HR 94 | Ht 65.0 in | Wt 209.2 lb

## 2022-03-11 DIAGNOSIS — R220 Localized swelling, mass and lump, head: Secondary | ICD-10-CM | POA: Diagnosis not present

## 2022-03-11 NOTE — Patient Instructions (Addendum)
It was nice seeing you today!  Go for your ultrasound as scheduled.  It could be possible that you have a salivary gland stone.  For this you could try some warm compresses and lemon juice.  Stay well, Zola Button, MD [place] [phone]  --  Make sure to check out at the front desk before you leave today.  Please arrive at least 15 minutes prior to your scheduled appointments.  If you had blood work today, I will send you a MyChart message or a letter if results are normal. Otherwise, I will give you a call.  If you had a referral placed, they will call you to set up an appointment. Please give [place] a call if you don't hear back in the next 2 weeks.  If you need additional refills before your next appointment, please call your pharmacy first.

## 2022-03-11 NOTE — Progress Notes (Signed)
    SUBJECTIVE:   CHIEF COMPLAINT / HPI:  Chief Complaint  Patient presents with   Ear Pain    Patient reports area of swelling on the right side of the neck and is concerned about a lymph node.  She reports that this swelled up about a month ago which went away, then came back yesterday.  She has had some left ear discomfort as well.  No recent illnesses.  Denies fever, chills, night sweats.  PERTINENT  PMH / PSH: no tobacco use, does vape  FMHx: Breast cancer - sister   Patient Care Team: Orvis Brill, DO as PCP - General (Family Medicine)   OBJECTIVE:   BP 119/86   Pulse 94   Ht 5\' 5"  (1.651 m)   Wt 209 lb 3.2 oz (94.9 kg)   LMP 02/16/2022   SpO2 100%   BMI 34.81 kg/m   Physical Exam Constitutional:      General: She is not in acute distress. HENT:     Mouth/Throat:     Mouth: Mucous membranes are moist.     Pharynx: Oropharynx is clear. No oropharyngeal exudate or posterior oropharyngeal erythema.     Comments: Barely palpable firm mass in the right submandibular area Cardiovascular:     Rate and Rhythm: Normal rate and regular rhythm.  Pulmonary:     Effort: Pulmonary effort is normal. No respiratory distress.     Breath sounds: Normal breath sounds.  Musculoskeletal:     Cervical back: Neck supple.  Lymphadenopathy:     Cervical: No cervical adenopathy.  Neurological:     Mental Status: She is alert.         03/11/2022    2:45 PM  Depression screen PHQ 2/9  Decreased Interest 1  Down, Depressed, Hopeless 0  PHQ - 2 Score 1  Altered sleeping 0  Tired, decreased energy 1  Change in appetite 0  Feeling bad or failure about yourself  0  Trouble concentrating 0  Moving slowly or fidgety/restless 0  Suicidal thoughts 0  PHQ-9 Score 2  Difficult doing work/chores Not difficult at all     {Show previous vital signs (optional):23777}    ASSESSMENT/PLAN:   1. Mass of right submandibular region Mass on the right submandibular area present for  1 day, has had similar symptoms previously.  Barely palpable on exam.  Suspect this is either lymphadenopathy or salivary gland stone.  No recent illnesses.  Recommended observation for now but patient would like imaging. - US Soft Tissue Head/Neck (NON-THYROID); Future   - sialogogues discussed - warm compresses, lemon juice  Return if symptoms worsen or fail to improve.   Zola Button, MD [place]

## 2022-03-19 ENCOUNTER — Ambulatory Visit (HOSPITAL_COMMUNITY)
Admission: RE | Admit: 2022-03-19 | Discharge: 2022-03-19 | Disposition: A | Discharge status: Home / Self Care | Payer: Medicaid Other | Source: Ambulatory Visit | Attending: Family Medicine | Admitting: Family Medicine

## 2022-03-19 DIAGNOSIS — R590 Localized enlarged lymph nodes: Secondary | ICD-10-CM | POA: Diagnosis not present

## 2022-03-19 DIAGNOSIS — R220 Localized swelling, mass and lump, head: Secondary | ICD-10-CM | POA: Insufficient documentation

## 2022-03-21 ENCOUNTER — Telehealth: Payer: Self-pay

## 2022-03-21 DIAGNOSIS — R220 Localized swelling, mass and lump, head: Secondary | ICD-10-CM

## 2022-03-21 NOTE — Telephone Encounter (Signed)
I spoke with patient again as she had some further questions about her test results and the need for CT scan.  I explained that the CT scan is to better characterize the mass that is seen on ultrasound and to evaluate for possible malignancy which we cannot be certain that this is or is not.

## 2022-03-21 NOTE — Telephone Encounter (Signed)
Patient calls nurse line requesting to speak with provider regarding results from Ultrasound.   Please return call to patient at [phone].   Talbot Grumbling, RN

## 2022-03-21 NOTE — Telephone Encounter (Signed)
Scheduled CT for patient. Gave verbal information when her appt is.

## 2022-03-21 NOTE — Telephone Encounter (Signed)
Spoke with patient reviewed ultrasound results with patient.  She will need to be set up for CT scan of the neck and ENT referral for further evaluation of left submandibular mass which I have placed.  All questions answered.

## 2022-04-04 ENCOUNTER — Ambulatory Visit (HOSPITAL_COMMUNITY)
Admission: RE | Admit: 2022-04-04 | Discharge: 2022-04-04 | Disposition: A | Discharge status: Home / Self Care | Payer: Medicaid Other | Source: Ambulatory Visit | Attending: Family Medicine | Admitting: Family Medicine

## 2022-04-04 DIAGNOSIS — R220 Localized swelling, mass and lump, head: Secondary | ICD-10-CM | POA: Insufficient documentation

## 2022-04-04 DIAGNOSIS — R590 Localized enlarged lymph nodes: Secondary | ICD-10-CM | POA: Diagnosis not present

## 2022-04-04 MED ORDER — IOHEXOL 350 MG/ML SOLN
75.0000 mL | Freq: Once | INTRAVENOUS | Status: AC | PRN
  Administered 2022-04-04: 75 mL via INTRAVENOUS

## 2022-04-07 ENCOUNTER — Telehealth: Payer: Self-pay

## 2022-04-07 DIAGNOSIS — R220 Localized swelling, mass and lump, head: Secondary | ICD-10-CM

## 2022-04-07 NOTE — Telephone Encounter (Signed)
Patient calls nurse line requesting results from CT.   Will forward to ordering provider.

## 2022-04-08 NOTE — Telephone Encounter (Signed)
Patient calls nurse line again in regards to results.

## 2022-04-08 NOTE — Telephone Encounter (Signed)
Spoke to patient regarding CT results.  Will refer her to ENT.  All questions answered.

## 2022-04-08 NOTE — Addendum Note (Signed)
Addended by: Zola Button D on: 04/08/2022 04:05 PM   Modules accepted: Orders

## 2022-04-08 NOTE — Telephone Encounter (Signed)
Patient calls nurse line returning phone call.   She reports she will start work at [employer] this morning and will not be able to answer the phone.   She reports you have leave a detailed VM if she does not answer.

## 2022-06-02 DIAGNOSIS — R220 Localized swelling, mass and lump, head: Secondary | ICD-10-CM | POA: Diagnosis not present

## 2022-06-02 DIAGNOSIS — K118 Other diseases of salivary glands: Secondary | ICD-10-CM | POA: Diagnosis not present

## 2022-06-04 ENCOUNTER — Other Ambulatory Visit (HOSPITAL_COMMUNITY): Payer: Self-pay | Admitting: Otolaryngology

## 2022-06-04 ENCOUNTER — Encounter: Payer: Self-pay | Admitting: General Practice

## 2022-06-04 DIAGNOSIS — K118 Other diseases of salivary glands: Secondary | ICD-10-CM

## 2022-06-04 NOTE — Progress Notes (Unsigned)
Gilmer Mor, DO  Nyiesha Beever, Katheren Shams, NT OK for US guided FNA of left submandibular gland mass.  Possible core biopsy, determined by doctor performing.  Wagner CT 04/04/22, [place] 03/19/22         Previous Messages    ----- Message ----- From: Caroleen Hamman, NT Sent: 06/04/2022  11:37 AM EDT To: [facility] Procedure Requests Subject: [place] CORE BIOPSY (SOFT TISSUE)                  Procedure: [place] CORE BIOPSY (SOFT TISSUE)  Reason: Submandibular gland mass  History: CT and [place] in chart  Provider: Christia Reading, MD  Contact: [phone]

## 2022-06-28 ENCOUNTER — Other Ambulatory Visit: Payer: Self-pay | Admitting: Student

## 2022-06-28 DIAGNOSIS — K118 Other diseases of salivary glands: Secondary | ICD-10-CM

## 2022-06-30 ENCOUNTER — Ambulatory Visit (HOSPITAL_COMMUNITY)
Admission: RE | Admit: 2022-06-30 | Discharge: 2022-06-30 | Disposition: A | Discharge status: Home / Self Care | Payer: Medicaid Other | Source: Ambulatory Visit | Attending: Otolaryngology | Admitting: Otolaryngology

## 2022-06-30 ENCOUNTER — Encounter (HOSPITAL_COMMUNITY): Payer: Self-pay

## 2022-06-30 ENCOUNTER — Other Ambulatory Visit: Payer: Self-pay

## 2022-06-30 DIAGNOSIS — D649 Anemia, unspecified: Secondary | ICD-10-CM | POA: Insufficient documentation

## 2022-06-30 DIAGNOSIS — D490 Neoplasm of unspecified behavior of digestive system: Secondary | ICD-10-CM | POA: Diagnosis not present

## 2022-06-30 DIAGNOSIS — K118 Other diseases of salivary glands: Secondary | ICD-10-CM | POA: Diagnosis not present

## 2022-06-30 DIAGNOSIS — R221 Localized swelling, mass and lump, neck: Secondary | ICD-10-CM | POA: Insufficient documentation

## 2022-06-30 LAB — PREGNANCY, URINE: Preg Test, Ur: NEGATIVE

## 2022-06-30 MED ORDER — LIDOCAINE HCL (PF) 1 % IJ SOLN: 3.0000 mL | Freq: Once | INTRAMUSCULAR | Status: DC

## 2022-06-30 MED ORDER — SODIUM CHLORIDE 0.9 % IV SOLN: INTRAVENOUS | Status: DC

## 2022-06-30 NOTE — Procedures (Signed)
Interventional Radiology Procedure:   Indications:  Left submandibular lesion  Procedure: US guided FNA  Findings: Left submandibular lesion. Multiple FNAs with 25 gauge needles  Complications: None     EBL: Minimal  Plan: Discharge to home.   Jasdeep Dejarnett R. Lowella Dandy, MD  Pager: [phone]

## 2022-06-30 NOTE — H&P (Signed)
Chief Complaint: Patient was seen in consultation today for left submandibular gland mass  at the request of Bates,Dwight  Referring Physician(s): Bates,Dwight  Supervising Physician: Richarda Overlie  Patient Status: [facility] - Out-pt  History of Present Illness: Jill Dennis is a 27 y.o. female with PMHs of anemia and left submandibular gland mass who presents for bx.   Patient was seen by medical provider on 03/11/22 swelling on the right side of the neck, US soft tissue on 03/21/22 showed enlarged but normal right LN and 13 mm LEFT submandibular gland mass. CT neck and head on 04/07/22 showed:   1. Solitary 13 mm hypodense or hypoenhancing mass within the left submandibular gland (sagittal image 63) corresponding to the Ultrasound finding. Cervical lymph nodes remain within normal limits. This is nonspecific, with top differential considerations of primary salivary neoplasm and lymphoproliferative disorder. Recommend histologic correlation.   2. Otherwise negative CT appearance of the Neck.  A biopsy was recommended to the patient, approval form was submitted and case approved for US guided FNA and/or Core bx of the LEFT submandibular gland mass by Dr. Loreta Ave.   Patient laying in bed, not in acute distress.  Denise headache, fever, chills, shortness of breath, cough, chest pain, abdominal pain, nausea ,vomiting, and bleeding.   Past Medical History:  Diagnosis Date   Anemia 2013   Heart murmur     Past Surgical History:  Procedure Laterality Date   NO PAST SURGERIES      Allergies: Penicillins, Sulfa antibiotics, and Sulfonamide derivatives  Medications: Prior to Admission medications   Medication Sig Start Date End Date Taking? Authorizing Provider  cetirizine (ZYRTEC) 10 MG tablet Take 1 tablet (10 mg total) by mouth daily. 10/29/20   Gustavus Bryant, FNP  fluticasone (FLONASE) 50 MCG/ACT nasal spray Place 1 spray into both nostrils daily. Limit use to 3 days 10/29/20    Gustavus Bryant, FNP  lidocaine (HM LIDOCAINE PATCH) 4 % Place 1 patch onto the skin daily. 07/19/21   Autry-Lott, Randa Evens, DO  norethindrone (ORTHO MICRONOR) 0.35 MG tablet Take 1 tablet (0.35 mg total) by mouth daily. 11/29/21   Darral Dash, DO     Family History  Problem Relation Age of Onset   Hypertension Mother    Diabetes Maternal Aunt    Hypertension Maternal Aunt    Thyroid disease Maternal Aunt    Hypertension Maternal Uncle    Diabetes Maternal Grandmother    Diabetes Maternal Grandfather     Social History   Socioeconomic History   Marital status: Single    Spouse name: Not on file   Number of children: Not on file   Years of education: Not on file   Highest education level: Not on file  Occupational History   Not on file  Tobacco Use   Smoking status: Some Days    Packs/day: .25    Types: Cigarettes, Cigars   Smokeless tobacco: Never  Vaping Use   Vaping Use: Never used  Substance and Sexual Activity   Alcohol use: Yes    Comment: social   Drug use: No    Comment: pt denies   Sexual activity: Yes    Birth control/protection: None  Other Topics Concern   Not on file  Social History Narrative   ETOH for first month of pregnancy   Social Determinants of Health   Financial Resource Strain: Not on file  Food Insecurity: Not on file  Transportation Needs: Not on file  Physical Activity:  Not on file  Stress: Not on file  Social Connections: Not on file     Review of Systems: A 12 point ROS discussed and pertinent positives are indicated in the HPI above.  All other systems are negative.  Vital Signs: BP 130/65   Pulse 100   Temp 98.2 F (36.8 C) (Temporal)   Resp 18   Ht 5\' 5"  (1.651 m)   Wt 210 lb (95.3 kg)   LMP 06/07/2022   SpO2 97%   BMI 34.95 kg/m    Physical Exam Vitals and nursing note reviewed.  Constitutional:      General: Patient is not in acute distress.    Appearance: Normal appearance. Patient is not ill-appearing.   HENT:     Head: Normocephalic and atraumatic.     Mouth/Throat:     Mouth: Mucous membranes are moist.     Pharynx: Oropharynx is clear.  Cardiovascular:     Rate and Rhythm: Normal rate and regular rhythm.     Pulses: Normal pulses.     Heart sounds: Normal heart sounds.  Pulmonary:     Effort: Pulmonary effort is normal.     Breath sounds: Normal breath sounds.  Abdominal:     General: Abdomen is flat. Bowel sounds are normal.     Palpations: Abdomen is soft.  Musculoskeletal:     Cervical back: Neck supple.  Skin:    General: Skin is warm and dry.     Coloration: Skin is not jaundiced or pale.  Neurological:     Mental Status: Patient is alert and oriented to person, place, and time.  Psychiatric:        Mood and Affect: Mood normal.        Behavior: Behavior normal.        Judgment: Judgment normal.    MD Evaluation Airway: WNL Heart: WNL Abdomen: WNL Chest/ Lungs: WNL ASA  Classification: 2 Mallampati/Airway Score: Two  Imaging: No results found.  Labs:  CBC: No results for input(s): "WBC", "HGB", "HCT", "PLT" in the last 8760 hours.  COAGS: No results for input(s): "INR", "APTT" in the last 8760 hours.  BMP: No results for input(s): "NA", "K", "CL", "CO2", "GLUCOSE", "BUN", "CALCIUM", "CREATININE", "GFRNONAA", "GFRAA" in the last 8760 hours.  Invalid input(s): "CMP"  LIVER FUNCTION TESTS: No results for input(s): "BILITOT", "AST", "ALT", "ALKPHOS", "PROT", "ALBUMIN" in the last 8760 hours.  TUMOR MARKERS: No results for input(s): "AFPTM", "CEA", "CA199", "CHROMGRNA" in the last 8760 hours.  Assessment and Plan: 28 y.o. female with LEFT submandibular mass who presents for FNA and possible core bx.   NPO since MN VSS Not on Ac/AP  Risks and benefits of left submandibular mass bx was discussed with the patient and/or patient's family including, but not limited to bleeding, infection, damage to adjacent structures or low yield requiring additional  tests.  All of the questions were answered and there is agreement to proceed.  Consent signed and in chart.  Patient wishes to proceed with local anesthetic only.  She was notified that she will be set up as the procedure was going to be performed with moderate sedation in case that the procedure becomes too uncomfortable. She verbalized understanding.    Thank you for this interesting consult.  I greatly enjoyed meeting Jill Dennis and look forward to participating in their care.  A copy of this report was sent to the requesting provider on this date.  Electronically Signed: Willette Brace, PA-C 06/30/2022, 11:43  AM   I spent a total of  30 Minutes   in face to face in clinical consultation, greater than 50% of which was counseling/coordinating care for LEFT submandibular mass FNA/Bx.   This chart was dictated using voice recognition software.  Despite best efforts to proofread,  errors can occur which can change the documentation meaning.

## 2022-07-01 LAB — CYTOLOGY - NON PAP

## 2022-11-03 ENCOUNTER — Ambulatory Visit: Payer: Medicaid Other | Admitting: Family Medicine

## 2022-11-03 ENCOUNTER — Ambulatory Visit: Payer: Medicaid Other | Admitting: Student

## 2022-11-18 ENCOUNTER — Encounter (HOSPITAL_COMMUNITY): Payer: Self-pay

## 2022-11-18 ENCOUNTER — Ambulatory Visit (HOSPITAL_COMMUNITY)
Admission: EM | Admit: 2022-11-18 | Discharge: 2022-11-18 | Disposition: A | Discharge status: Home / Self Care | Payer: Medicaid Other | Attending: Family Medicine | Admitting: Family Medicine

## 2022-11-18 DIAGNOSIS — M25511 Pain in right shoulder: Secondary | ICD-10-CM

## 2022-11-18 DIAGNOSIS — M542 Cervicalgia: Secondary | ICD-10-CM

## 2022-11-18 MED ORDER — KETOROLAC TROMETHAMINE 30 MG/ML IJ SOLN
INTRAMUSCULAR | Status: AC
  Filled 2022-11-18: qty 1

## 2022-11-18 MED ORDER — KETOROLAC TROMETHAMINE 10 MG PO TABS: 10.0000 mg | ORAL_TABLET | Freq: Four times a day (QID) | ORAL | 0 refills | Status: DC | PRN

## 2022-11-18 MED ORDER — KETOROLAC TROMETHAMINE 30 MG/ML IJ SOLN
30.0000 mg | Freq: Once | INTRAMUSCULAR | Status: AC
  Administered 2022-11-18: 30 mg via INTRAMUSCULAR

## 2022-11-18 NOTE — ED Triage Notes (Signed)
Patient here today with c/o right side neck pain that radiates down her right shoulder and right arm that started upon waking this morning. She tried taking Tylenol with no relief.

## 2022-11-18 NOTE — Discharge Instructions (Signed)
You have been given a shot of Toradol 30 mg today.  Ketorolac 10 mg tablets--take 1 tablet every 6 hours as needed for pain.  This is the same medicine that is in the shot we just gave you   

## 2022-11-18 NOTE — ED Provider Notes (Signed)
[facility]    CSN: [id number] Arrival date & time: 11/18/22  1429      History   Chief Complaint Chief Complaint  Patient presents with   Neck Pain    HPI Jill Dennis is a 27 y.o. female.    Neck Pain Here for right neck pain that radiates to right arm. First noted this AM.  She has had this before, but usually gets better as day goes on.  No f/c. No trauma.   No dyspnea.  Pain radiates to upper part of humerus, described as numbness and tingling. Worse with movement.  LMP 10/26/22  Past Medical History:  Diagnosis Date   Anemia 2013   Heart murmur     Patient Active Problem List   Diagnosis Date Noted   Heavy periods 11/29/2021   No-show for appointment 08/17/2021   Migraine 05/08/2019   Anemia 01/27/2012    Past Surgical History:  Procedure Laterality Date   NO PAST SURGERIES      OB History     Gravida  1   Para  1   Term  1   Preterm  0   AB  0   Living  1      SAB  0   IAB  0   Ectopic  0   Multiple  0   Live Births  1            Home Medications    Prior to Admission medications   Medication Sig Start Date End Date Taking? Authorizing Provider  ketorolac (TORADOL) 10 MG tablet Take 1 tablet (10 mg total) by mouth every 6 (six) hours as needed (pain). 11/18/22  Yes Zenia Resides, MD    Family History Family History  Problem Relation Age of Onset   Hypertension Mother    Diabetes Maternal Aunt    Hypertension Maternal Aunt    Thyroid disease Maternal Aunt    Hypertension Maternal Uncle    Diabetes Maternal Grandmother    Diabetes Maternal Grandfather     Social History Social History   Tobacco Use   Smoking status: Some Days    Current packs/day: 0.25    Types: Cigarettes, Cigars   Smokeless tobacco: Never  Vaping Use   Vaping status: Every Day  Substance Use Topics   Alcohol use: Yes    Comment: social   Drug use: No    Comment: pt denies     Allergies   Penicillins, Sulfa  antibiotics, and Sulfonamide derivatives   Review of Systems Review of Systems  Musculoskeletal:  Positive for neck pain.     Physical Exam Triage Vital Signs ED Triage Vitals  Encounter Vitals Group     BP 11/18/22 1521 112/74     Systolic BP Percentile --      Diastolic BP Percentile --      Pulse Rate 11/18/22 1521 87     Resp 11/18/22 1521 16     Temp 11/18/22 1521 98.7 F (37.1 C)     Temp Source 11/18/22 1521 Oral     SpO2 11/18/22 1521 98 %     Weight 11/18/22 1521 215 lb (97.5 kg)     Height 11/18/22 1521 5\' 5"  (1.651 m)     Head Circumference --      Peak Flow --      Pain Score 11/18/22 1520 8     Pain Loc --      Pain Education --  Exclude from Growth Chart --    No data found.  Updated Vital Signs BP 112/74 (BP Location: Right Arm)   Pulse 87   Temp 98.7 F (37.1 C) (Oral)   Resp 16   Ht 5\' 5"  (1.651 m)   Wt 97.5 kg   LMP 10/26/2022 (Approximate)   SpO2 98%   BMI 35.78 kg/m   Visual Acuity Right Eye Distance:   Left Eye Distance:   Bilateral Distance:    Right Eye Near:   Left Eye Near:    Bilateral Near:     Physical Exam Vitals reviewed.  Constitutional:      General: She is not in acute distress.    Appearance: She is not ill-appearing, toxic-appearing or diaphoretic.  HENT:     Mouth/Throat:     Mouth: Mucous membranes are moist.  Eyes:     Extraocular Movements: Extraocular movements intact.     Pupils: Pupils are equal, round, and reactive to light.  Neck:     Comments: There is tenderness of the right trapezius and the neck and posterior shoulder.  No rash there. Cardiovascular:     Rate and Rhythm: Normal rate and regular rhythm.  Pulmonary:     Effort: Pulmonary effort is normal.     Breath sounds: Normal breath sounds.  Musculoskeletal:     Cervical back: Neck supple.  Skin:    Coloration: Skin is not pale.  Neurological:     Mental Status: She is alert and oriented to person, place, and time.  Psychiatric:         Behavior: Behavior normal.      UC Treatments / Results  Labs (all labs ordered are listed, but only abnormal results are displayed) Labs Reviewed - No data to display  EKG   Radiology No results found.  Procedures Procedures (including critical care time)  Medications Ordered in UC Medications  ketorolac (TORADOL) 30 MG/ML injection 30 mg (has no administration in time range)    Initial Impression / Assessment and Plan / UC Course  I have reviewed the triage vital signs and the nursing notes.  Pertinent labs & imaging results that were available during my care of the patient were reviewed by me and considered in my medical decision making (see chart for details).       Toradol injection is given here and Toradol tablets are sent to the pharmacy.  She does not want any prescription be sedating.  Sling is provided to see if that will help the shoulder pain that she is having. Final Clinical Impressions(s) / UC Diagnoses   Final diagnoses:  Neck pain  Acute pain of right shoulder     Discharge Instructions      You have been given a shot of Toradol 30 mg today.  Ketorolac 10 mg tablets--take 1 tablet every 6 hours as needed for pain.  This is the same medicine that is in the shot we just gave you       ED Prescriptions     Medication Sig Dispense Auth. Provider   ketorolac (TORADOL) 10 MG tablet Take 1 tablet (10 mg total) by mouth every 6 (six) hours as needed (pain). 20 tablet Keoshia Steinmetz, Janace Aris, MD      PDMP not reviewed this encounter.   Zenia Resides, MD 11/18/22 [phone]

## 2022-12-08 ENCOUNTER — Ambulatory Visit (INDEPENDENT_AMBULATORY_CARE_PROVIDER_SITE_OTHER): Payer: Medicaid Other | Admitting: Student

## 2022-12-08 VITALS — BP 115/70 | HR 82 | Ht 65.0 in | Wt 206.4 lb

## 2022-12-08 DIAGNOSIS — N920 Excessive and frequent menstruation with regular cycle: Secondary | ICD-10-CM

## 2022-12-08 MED ORDER — NAPROXEN 500 MG PO TABS: ORAL_TABLET | ORAL | 0 refills | Status: DC

## 2022-12-08 MED ORDER — NORGESTIMATE-ETH ESTRADIOL 0.25-35 MG-MCG PO TABS: 1.0000 | ORAL_TABLET | Freq: Every day | ORAL | 11 refills | Status: AC

## 2022-12-08 NOTE — Assessment & Plan Note (Signed)
Reported heavy bleeding plus cramping consistent with dysmenorrhea. Plan to trial naproxen 500 mg twice daily on day 1 of menses until completion of menses. No contraindications to combined oral OCP, Sprintec sent to pharmacy.  Discussed that this may help with regulating lining of endometrium and relieve some of her heavy bleeding. Should symptoms persist, could consider further workup with ultrasound for possible other differential diagnoses: Structural abnormality such as fibroids, adenomyosis,  PCOS (although unlikely given lack of irregularity, hyperandrogenism), endometriosis.   Check CBC, iron panel today Return in 1 month for follow-up, and patient would also like to discuss weight management and GLP-1's at that time

## 2022-12-08 NOTE — Patient Instructions (Addendum)
It was great seeing you today.  As we discussed, -Take 1 tablet of naproxen twice daily with food on day one of your menstrual cycle, and continue until you have stopped bleeding -I also sent in a birth control pack for you that has estrogen and progesterone, to help with your periods  We are checking blood work today.  I will call you if anything is abnormal.  Come back and see me in a month and we will also discuss weight management.   If you have any questions or concerns, please feel free to call the clinic.   Have a wonderful day,  Dr. Darral Dash [facility] [phone]

## 2022-12-08 NOTE — Progress Notes (Signed)
    SUBJECTIVE:   CHIEF COMPLAINT / HPI:   Jill Dennis is a 27 year-old female here to discuss painful menses.  Reports really bad pain, says that nothing helps.  She tried taking ibuprofen. Her mom said she needs to get medicine for PMS.  She was considering hormone therapy with birth control. Jill Dennis may not want to have another child.  Menses last 5 days, and she has them every 28 days. Uses 7-8 overnight pads a day during her period. Has nausea, but no vomiting.  Denies tobacco use.   PERTINENT  PMH / PSH: Dysmenorrhea  OBJECTIVE:   BP 115/70   Pulse 82   Ht 5\' 5"  (1.651 m)   Wt 206 lb 6.4 oz (93.6 kg)   LMP 10/26/2022 (Approximate)   SpO2 99%   BMI 34.35 kg/m   General: Pleasant and well-appearing, good spirits Respiratory: Normal work of breathing on room air Abdomen: Soft, NTND   ASSESSMENT/PLAN:   Heavy periods Reported heavy bleeding plus cramping consistent with dysmenorrhea. Plan to trial naproxen 500 mg twice daily on day 1 of menses until completion of menses. No contraindications to combined oral OCP, Sprintec sent to pharmacy.  Discussed that this may help with regulating lining of endometrium and relieve some of her heavy bleeding. Should symptoms persist, could consider further workup with ultrasound for possible other differential diagnoses: Structural abnormality such as fibroids, adenomyosis,  PCOS (although unlikely given lack of irregularity, hyperandrogenism), endometriosis.   Check CBC, iron panel today Return in 1 month for follow-up, and patient would also like to discuss weight management and GLP-1's at that time     Jill Dash, DO [facility]

## 2022-12-09 LAB — IRON,TIBC AND FERRITIN PANEL
Ferritin: 8 ng/mL — ABNORMAL LOW (ref 15–150)
Iron Saturation: 6 % — CL (ref 15–55)
Iron: 22 ug/dL — ABNORMAL LOW (ref 27–159)
Total Iron Binding Capacity: 384 ug/dL (ref 250–450)
UIBC: 362 ug/dL (ref 131–425)

## 2022-12-09 LAB — CBC
Hematocrit: 32.7 % — ABNORMAL LOW (ref 34.0–46.6)
Hemoglobin: 10.1 g/dL — ABNORMAL LOW (ref 11.1–15.9)
MCH: 27.5 pg (ref 26.6–33.0)
MCHC: 30.9 g/dL — ABNORMAL LOW (ref 31.5–35.7)
MCV: 89 fL (ref 79–97)
Platelets: 333 10*3/uL (ref 150–450)
RBC: 3.67 x10E6/uL — ABNORMAL LOW (ref 3.77–5.28)
RDW: 13.2 % (ref 11.7–15.4)
WBC: 5.1 10*3/uL (ref 3.4–10.8)

## 2022-12-11 ENCOUNTER — Other Ambulatory Visit: Payer: Self-pay | Admitting: Student

## 2022-12-11 ENCOUNTER — Encounter: Payer: Self-pay | Admitting: Student

## 2022-12-11 ENCOUNTER — Telehealth: Payer: Self-pay

## 2022-12-11 DIAGNOSIS — D500 Iron deficiency anemia secondary to blood loss (chronic): Secondary | ICD-10-CM

## 2022-12-11 MED ORDER — FERROUS SULFATE 325 (65 FE) MG PO TABS: 325.0000 mg | ORAL_TABLET | Freq: Every day | ORAL | 3 refills | Status: AC

## 2022-12-11 NOTE — Progress Notes (Signed)
Ferrous sulfate 325 mg tablet daily sent to [facility].

## 2022-12-11 NOTE — Telephone Encounter (Signed)
Returned call to patient. Results discussed, will also set up IV infusion.

## 2022-12-11 NOTE — Progress Notes (Signed)
Outpatient IV iron referral for iron deficiency anemia 2/2 chronic blood loss (menses) ordered.

## 2022-12-11 NOTE — Telephone Encounter (Signed)
Patient calls nurse line reporting she just missed a all from PCP.   Patient advised to keep her phone on her for when PCP calls her back.   Will forward to PCP.

## 2022-12-12 ENCOUNTER — Encounter: Payer: Self-pay | Admitting: Student

## 2022-12-15 ENCOUNTER — Ambulatory Visit: Payer: Medicaid Other | Admitting: Student

## 2022-12-15 ENCOUNTER — Telehealth: Payer: Self-pay

## 2022-12-15 ENCOUNTER — Other Ambulatory Visit: Payer: Self-pay | Admitting: Student

## 2022-12-15 DIAGNOSIS — D500 Iron deficiency anemia secondary to blood loss (chronic): Secondary | ICD-10-CM

## 2022-12-15 NOTE — Telephone Encounter (Signed)
Called patient to verify that she is aware of upcoming Iron infusion.  She is aware via MYCHART.  Glennie Hawk, CMA

## 2022-12-20 ENCOUNTER — Other Ambulatory Visit (HOSPITAL_COMMUNITY): Payer: Self-pay

## 2022-12-22 ENCOUNTER — Ambulatory Visit (HOSPITAL_COMMUNITY)
Admission: RE | Admit: 2022-12-22 | Discharge: 2022-12-22 | Disposition: A | Discharge status: Home / Self Care | Payer: Medicaid Other | Source: Ambulatory Visit | Attending: Family Medicine | Admitting: Family Medicine

## 2022-12-22 DIAGNOSIS — D500 Iron deficiency anemia secondary to blood loss (chronic): Secondary | ICD-10-CM | POA: Insufficient documentation

## 2022-12-22 MED ORDER — SODIUM CHLORIDE 0.9 % IV SOLN
510.0000 mg | Freq: Once | INTRAVENOUS | Status: AC
  Administered 2022-12-22: 510 mg via INTRAVENOUS
  Filled 2022-12-22: qty 510

## 2022-12-22 MED ORDER — FERUMOXYTOL INJECTION 510 MG/17 ML
510.0000 mg | Freq: Once | INTRAVENOUS | Status: DC
  Filled 2022-12-22: qty 17

## 2023-01-22 ENCOUNTER — Other Ambulatory Visit (HOSPITAL_COMMUNITY): Payer: Self-pay

## 2023-01-22 ENCOUNTER — Ambulatory Visit (INDEPENDENT_AMBULATORY_CARE_PROVIDER_SITE_OTHER): Payer: Medicaid Other | Admitting: Student

## 2023-01-22 VITALS — BP 119/78 | HR 97 | Ht 65.0 in | Wt 205.2 lb

## 2023-01-22 DIAGNOSIS — E66811 Obesity, class 1: Secondary | ICD-10-CM | POA: Diagnosis not present

## 2023-01-22 DIAGNOSIS — N920 Excessive and frequent menstruation with regular cycle: Secondary | ICD-10-CM

## 2023-01-22 DIAGNOSIS — N946 Dysmenorrhea, unspecified: Secondary | ICD-10-CM

## 2023-01-22 MED ORDER — WEGOVY 0.25 MG/0.5ML ~~LOC~~ SOAJ
0.2500 mg | SUBCUTANEOUS | 0 refills | Status: AC
  Filled 2023-01-22 – 2023-01-29 (×2): qty 2, 28d supply, fill #0

## 2023-01-22 NOTE — Progress Notes (Signed)
    SUBJECTIVE:   CHIEF COMPLAINT / HPI:   Jill Dennis is a 27 year-old here to discuss weight loss and painful menses.  Obesity Works in [employer] at [employer] x3 per week, usually about 30 to 45 minutes per session. Interested in starting medication to help with weight loss.  24 hour food recall: Breakfast: None Snacks: None Lunch: Chicken alfredo Soda: Ginger ale Dinner: [employer] Sub  Painful menses with iron deficiency anemia due to chronic blood loss from menses Was last seen for this on 12/08/2022, reports having used 7 8 overnight pads a day during her period. We trialed naproxen 500 mg twice daily as well as combined oral OCP. Patient reports compliance with this and that she is still having intense pain that "feels like contractions."  PERTINENT  PMH / PSH: Dysmenorrhea Iron deficiency anemia Obesity  OBJECTIVE:   BP 119/78   Pulse 97   Ht 5\' 5"  (1.651 m)   Wt 205 lb 3.2 oz (93.1 kg)   SpO2 99%   BMI 34.15 kg/m   General: Pleasant and well-appearing in no distress Cardiac: Regular rate and rhythm Respiratory: Normal work of breathing on room air with no wheezing or crackles   ASSESSMENT/PLAN:   Menorrhagia Did not have improvement with naproxen and combined OCP. Due to reported severity of pain despite above interventions, ordered transvaginal/complete pelvic ultrasound to rule out structural abnormalities such as fibroids, adenomyosis. Encouraged to continue to take naproxen at day 1 of menses and until cessation of bleeding.  Obesity BMI 34.15.  On 24-hour food recall, it seems that there are minimal if any vegetables/fruits,, fiber. Discussed increasing protein with lean meats, beans, etc. and other dietary changes. Encouraged to avoid sugary sodas, and transition to diet sodas or club soda with a small amount of juice. We had a long discussion regarding medical treatment and lifestyle changes. I encouraged her to  initially trial food journal with monitoring calories, and incorporating more physical activity into her week. In addition to this, I sent GLP-1 Wegovy to the pharmacy.  Discussed risk/benefits, and she does not have any contraindications to this medication.   Discussed that I am unsure if we will be able to get insurance coverage, and that we should work on lifestyle changes initially.     Darral Dash, DO [facility]

## 2023-01-22 NOTE — Patient Instructions (Addendum)
It was great seeing you today.  As we discussed, Jill Dennis was sent to your pharmacy for weight loss. This can take 1-2 weeks for insurance to authorize.  [phone] - Continue to try to focus on avoiding high calorie foods (fried food, sugary sodas- diet is preferred) and continue to get 150 minutes of exercise per week. - We scheduled your pelvic ultrasound for painful periods.  For your thyroid biopsy results, please call:  Christia Reading, MD Otolaryngology  [phone]     If you have any questions or concerns, please feel free to call the clinic.   Have a wonderful day,  Dr. Darral Dash [facility] [phone]

## 2023-01-23 DIAGNOSIS — E669 Obesity, unspecified: Secondary | ICD-10-CM | POA: Insufficient documentation

## 2023-01-23 NOTE — Assessment & Plan Note (Signed)
BMI 34.15.  On 24-hour food recall, it seems that there are minimal if any vegetables/fruits,, fiber. Discussed increasing protein with lean meats, beans, etc. and other dietary changes. Encouraged to avoid sugary sodas, and transition to diet sodas or club soda with a small amount of juice. We had a long discussion regarding medical treatment and lifestyle changes. I encouraged her to initially trial food journal with monitoring calories, and incorporating more physical activity into her week. In addition to this, I sent GLP-1 Wegovy to the pharmacy.  Discussed risk/benefits, and she does not have any contraindications to this medication.   Discussed that I am unsure if we will be able to get insurance coverage, and that we should work on lifestyle changes initially.

## 2023-01-23 NOTE — Assessment & Plan Note (Signed)
Did not have improvement with naproxen and combined OCP. Due to reported severity of pain despite above interventions, ordered transvaginal/complete pelvic ultrasound to rule out structural abnormalities such as fibroids, adenomyosis. Encouraged to continue to take naproxen at day 1 of menses and until cessation of bleeding.

## 2023-01-29 ENCOUNTER — Telehealth: Payer: Self-pay

## 2023-01-29 ENCOUNTER — Other Ambulatory Visit (HOSPITAL_COMMUNITY): Payer: Self-pay

## 2023-01-29 NOTE — Telephone Encounter (Signed)
Pharmacy Patient Advocate Encounter   Received notification from CoverMyMeds that prior authorization for WEGOVY (0.25MG ) is required/requested.    The patient is insured through [facility] .   Per test claim: PA required; PA submitted to above mentioned insurance via CoverMyMeds Key/confirmation #/EOC [id number]. Status is pending

## 2023-01-30 NOTE — Telephone Encounter (Signed)
Pharmacy Patient Advocate Encounter  Received notification from [facility] that Prior Authorization for [facility] 0.25MG  has been APPROVED from 01/29/23 to 07/28/23

## 2023-02-09 ENCOUNTER — Ambulatory Visit (HOSPITAL_COMMUNITY)
Admission: RE | Admit: 2023-02-09 | Discharge: 2023-02-09 | Disposition: A | Discharge status: Home / Self Care | Payer: Medicaid Other | Source: Ambulatory Visit | Attending: Family Medicine | Admitting: Family Medicine

## 2023-02-09 DIAGNOSIS — N946 Dysmenorrhea, unspecified: Secondary | ICD-10-CM

## 2023-02-09 DIAGNOSIS — N83291 Other ovarian cyst, right side: Secondary | ICD-10-CM | POA: Diagnosis not present

## 2023-03-02 ENCOUNTER — Ambulatory Visit (INDEPENDENT_AMBULATORY_CARE_PROVIDER_SITE_OTHER): Payer: Medicaid Other | Admitting: Student

## 2023-03-02 ENCOUNTER — Encounter: Payer: Self-pay | Admitting: Student

## 2023-03-02 VITALS — BP 129/80 | HR 98 | Ht 65.0 in | Wt 207.2 lb

## 2023-03-02 DIAGNOSIS — R5383 Other fatigue: Secondary | ICD-10-CM | POA: Diagnosis not present

## 2023-03-02 DIAGNOSIS — N920 Excessive and frequent menstruation with regular cycle: Secondary | ICD-10-CM | POA: Diagnosis not present

## 2023-03-02 DIAGNOSIS — D500 Iron deficiency anemia secondary to blood loss (chronic): Secondary | ICD-10-CM | POA: Diagnosis not present

## 2023-03-02 NOTE — Progress Notes (Signed)
    SUBJECTIVE:   CHIEF COMPLAINT / HPI:   Jill Dennis is a 28 year-old female here to follow-up on iron deficiency anemia. She has a history of menorrhagia with heavy bleeding.  In October 2024, iron level was 22, ferritin 8, hemoglobin 10.1. She had Feraheme IV iron 150 mg infusion on 12/22/2022. She is here today because she would like her iron levels rechecked.  Denies any active bleeding today.  No rectal bleeding, epistaxis, other sources of bleeding other than heavy menstruation.  She is taking ferrous sulfate 325 mg daily without difficulty.  Reports***symptoms.  PERTINENT  PMH / PSH: Obesity Menorrhagia Iron deficiency anemia  OBJECTIVE:   Ht 5\' 5"  (1.651 m)   Wt 207 lb 3.2 oz (94 kg)   BMI 34.48 kg/m   General: NAD, well appearing Cardiac: RRR Neuro: A&O Respiratory: normal WOB on RA. No wheezing or crackles on auscultation, good lung sounds throughout Extremities: Moving all 4 extremities equally    ASSESSMENT/PLAN:   Anemia S/p iron infusion November 2024. Repeat CBC, iron/TIBC/ferritin panel today. Continue ferrous sulfate 325 mg daily     Darral Dash, DO [facility]    {    This will disappear when note is signed, click to select method of visit    :1}

## 2023-03-02 NOTE — Assessment & Plan Note (Signed)
S/p iron infusion November 2024. Repeat CBC, iron/TIBC/ferritin panel today. Continue ferrous sulfate 325 mg daily

## 2023-03-02 NOTE — Patient Instructions (Addendum)
It was great seeing you today.  As we discussed, -We are checking your blood work today to see where hemoglobin and iron levels are -Continue taking your daily iron pill for now.  -I sent referral to the OB/GYN for your heavy periods to discuss options   If you have any questions or concerns, please feel free to call the clinic.   Have a wonderful day,  Dr. Darral Dash [facility] [phone]

## 2023-03-03 ENCOUNTER — Encounter: Payer: Self-pay | Admitting: Student

## 2023-03-03 LAB — CBC
Hematocrit: 38 % (ref 34.0–46.6)
Hemoglobin: 12.3 g/dL (ref 11.1–15.9)
MCH: 29.6 pg (ref 26.6–33.0)
MCHC: 32.4 g/dL (ref 31.5–35.7)
MCV: 92 fL (ref 79–97)
Platelets: 322 10*3/uL (ref 150–450)
RBC: 4.15 x10E6/uL (ref 3.77–5.28)
RDW: 12.2 % (ref 11.7–15.4)
WBC: 4.7 10*3/uL (ref 3.4–10.8)

## 2023-03-03 LAB — IRON,TIBC AND FERRITIN PANEL
Ferritin: 36 ng/mL (ref 15–150)
Iron Saturation: 25 % (ref 15–55)
Iron: 80 ug/dL (ref 27–159)
Total Iron Binding Capacity: 320 ug/dL (ref 250–450)
UIBC: 240 ug/dL (ref 131–425)

## 2023-03-03 LAB — TSH RFX ON ABNORMAL TO FREE T4: TSH: 1.17 u[IU]/mL (ref 0.450–4.500)

## 2023-03-03 NOTE — Assessment & Plan Note (Addendum)
Reassuringly, pelvic ultrasound with transvaginal showed 2 cm ovarian cyst, but otherwise normal pelvic ultrasound. Referral to OB/GYN placed today, per discussion with patient. She is still uncertain as to whether she would like to continue to grow her family and have another child, we reviewed contraceptive options that can help with heavy bleeding such as the hormonal IUD. Patient would like to have further discussion with OB/GYN.  She mentioned at this point, she is considering hysterectomy but we had a long discussion that there are other means to help alleviate her heavy menses but still would preserve her fertility.

## 2023-03-12 ENCOUNTER — Other Ambulatory Visit: Payer: Self-pay | Admitting: Medical Genetics

## 2023-03-17 ENCOUNTER — Ambulatory Visit: Payer: Medicaid Other | Admitting: Student

## 2023-05-04 ENCOUNTER — Encounter: Payer: Medicaid Other | Admitting: Family Medicine

## 2023-06-15 ENCOUNTER — Encounter: Payer: Self-pay | Admitting: Family Medicine

## 2023-07-01 ENCOUNTER — Ambulatory Visit (INDEPENDENT_AMBULATORY_CARE_PROVIDER_SITE_OTHER): Admitting: Family Medicine

## 2023-07-01 VITALS — BP 126/81 | HR 110 | Ht 65.0 in | Wt 211.6 lb

## 2023-07-01 DIAGNOSIS — R220 Localized swelling, mass and lump, head: Secondary | ICD-10-CM | POA: Diagnosis not present

## 2023-07-01 DIAGNOSIS — J069 Acute upper respiratory infection, unspecified: Secondary | ICD-10-CM

## 2023-07-01 NOTE — Patient Instructions (Signed)
 Use tylenol  and ibuprofen  for pain. Use claritin/zyrtec  and flonase  for congestion/sneezing. Come back if worsening or not improving.

## 2023-07-01 NOTE — Assessment & Plan Note (Addendum)
 Appreciated on exam. 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 Encouraged to contact ENT about these results and further follow up.

## 2023-07-01 NOTE — Progress Notes (Signed)
    SUBJECTIVE:   CHIEF COMPLAINT / HPI:   Sick symptoms Started on Monday.  Continue to get worse.  Feels her sinuses are clogged as well as her ears.  She has headaches.  Feels she has hot flashes but no true fevers.  She has tried humidifiers, steam showers, Vicks vapor rub to help with this without much relief.  PERTINENT  PMH / PSH: Migraines, menorrhagia, anemia  OBJECTIVE:   BP 126/81   Pulse (!) 102   Ht 5\' 5"  (1.651 m)   Wt 211 lb 9.6 oz (96 kg)   SpO2 100%   BMI 35.21 kg/m   General: Alert and oriented, in NAD Skin: Warm, dry, and intact without lesions HEENT: NCAT, EOM grossly normal, midline nasal septum, posterior oropharyngeal erythema without tonsillar hypertrophy or exudates, L 1-2 cm submandibular mass that is non-tender, bilateral TM normal Cardiac: RRR, no m/r/g appreciated Respiratory: CTAB, breathing and speaking comfortably on RA Abdominal: Soft, nontender, nondistended, normoactive bowel sounds Extremities: Moves all extremities grossly equally Neurological: No gross focal deficit Psychiatric: Appropriate mood and affect  ASSESSMENT/PLAN:   Assessment & Plan Viral URI with cough History and exam most concerning for viral process.  Reassuringly no focal findings on exam.  Recommended conservative management with continued OTC meds including Tylenol /ibuprofen  as well as honey and warm tea. Can also use OTC claritin/zyrtec  and flonase  for symptom management. Discussed returning to care should she have fever, chest pain, increasing shortness of breath, or otherwise is not improving. Mass of left submandibular region Appreciated on exam. 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 Encouraged to contact ENT about these results and further follow up.   Jill Kenning, MD [facility]

## 2023-11-11 ENCOUNTER — Other Ambulatory Visit (HOSPITAL_COMMUNITY): Payer: Self-pay

## 2023-11-30 ENCOUNTER — Other Ambulatory Visit: Payer: Self-pay | Admitting: Genetic Counselor

## 2023-11-30 DIAGNOSIS — Z006 Encounter for examination for normal comparison and control in clinical research program: Secondary | ICD-10-CM

## 2024-01-01 ENCOUNTER — Other Ambulatory Visit (HOSPITAL_COMMUNITY): Payer: Self-pay

## 2024-01-01 ENCOUNTER — Ambulatory Visit: Admitting: Family Medicine

## 2024-01-01 VITALS — BP 120/80 | HR 86 | Ht 65.0 in | Wt 214.0 lb

## 2024-01-01 DIAGNOSIS — M65949 Unspecified synovitis and tenosynovitis, unspecified hand: Secondary | ICD-10-CM

## 2024-01-01 MED ORDER — MELOXICAM 15 MG PO TABS: 15.0000 mg | ORAL_TABLET | Freq: Every day | ORAL | 0 refills | Status: AC

## 2024-01-01 NOTE — Patient Instructions (Addendum)
 I have sent in meloxicam  to take daily for 7 days. Take with tylenol . I also have you a splint. Come back if worsening in 24 hours.

## 2024-01-01 NOTE — Progress Notes (Signed)
   SUBJECTIVE:   CHIEF COMPLAINT / HPI:  Discussed the use of AI scribe software for clinical note transcription with the patient, who gave verbal consent to proceed.  History of Present Illness Jill Dennis is a 28 year old female who presents with finger pain.  R third digital pain - Onset Sunday night - Initially involved entire hand, now localized to one finger - Pain described as stinging, burning, and shooting, especially with finger flexion - Throbbing and aching pain that intensifies with movement, absent at rest - No history of trauma, insect bites, or new medications - No fever or erythema - Initial swelling present, now limited to the affected finger - Frequent typing exacerbates symptoms, and she types a lot at work  PERTINENT  PMH / PSH: no hx diabetes or other immunocompromising condition  OBJECTIVE:  BP 120/80 (BP Location: Left Arm, Patient Position: Sitting, Cuff Size: Normal)   Pulse 86   Ht 5' 5 (1.651 m)   Wt 214 lb (97.1 kg)   SpO2 100%   BMI 35.61 kg/m   Physical Exam GENERAL: Alert, cooperative, well developed, no acute distress EXTREMITIES: No cyanosis. She does have pain on palpation of the R third digit along flexor and extensor tendon territory, no significant swelling or erythema compared to other fingers, no bruising, sensation intact in the finger, grip strength intact when testing all but the affected finger due to pain NEUROLOGICAL: Cranial nerves grossly intact, moves all extremities without gross motor or sensory deficit  ASSESSMENT/PLAN:   Assessment & Plan Tenosynovitis of finger Suspected tenosynovitis due to inflammation along the tendon, likely exacerbated by frequent typing. Infection unlikely given no fevers, no swelling on my exam today, and no erythema of the finger/surrounding joints. - Prescribed meloxicam  for anti-inflammatory and pain relief x7 days - Provided a splint to immobilize the finger for comfort - Advised taking  Tylenol  with meloxicam  for additional pain relief. - Instructed to return if no improvement or worsening after 24-48 hours, consider infectious causes and XR at that point - Advised against using other NSAIDs concurrently with meloxicam .  Stuart Redo, MD [facility]

## 2024-02-25 ENCOUNTER — Telehealth: Payer: Self-pay

## 2024-02-25 MED ORDER — NAPROXEN 500 MG PO TABS: 500.0000 mg | ORAL_TABLET | Freq: Two times a day (BID) | ORAL | 0 refills | Status: AC

## 2024-02-25 NOTE — Telephone Encounter (Signed)
 Called patient and provided with update per note from Dr. Rumball.   Patient appreciative.   Jill Dennis English, RN

## 2024-02-25 NOTE — Telephone Encounter (Signed)
 Patient calls nurse line requesting a refill on Naproxen .  She reports she takes this medication for her menstrual cramps.   This medication is no longer on her medication list.   Advised will forward to PCP.   [facility] [address]

## 2024-02-25 NOTE — Telephone Encounter (Signed)
 Rx sent. If not helpful, make an appt to discuss options.  Thanks!

## 2024-03-04 ENCOUNTER — Other Ambulatory Visit

## 2024-03-14 ENCOUNTER — Ambulatory Visit: Admitting: Student
